# Patient Record
Sex: Male | Born: 1990 | Race: Black or African American | Hispanic: No | Marital: Single | Smoking: Current every day smoker
Health system: Southern US, Community
[De-identification: ages and names within clinical notes are randomized; demographics above are authoritative.]

## PROBLEM LIST (undated history)

## (undated) DIAGNOSIS — N442 Benign cyst of testis: Secondary | ICD-10-CM

## (undated) DIAGNOSIS — K219 Gastro-esophageal reflux disease without esophagitis: Secondary | ICD-10-CM

## (undated) DIAGNOSIS — F419 Anxiety disorder, unspecified: Secondary | ICD-10-CM

## (undated) HISTORY — PX: ESOPHAGOGASTRODUODENOSCOPY: SHX1529

## (undated) HISTORY — PX: HERNIA REPAIR: SHX51

---

## 2019-05-15 ENCOUNTER — Emergency Department (HOSPITAL_COMMUNITY)
Admission: EM | Admit: 2019-05-15 | Discharge: 2019-05-15 | Disposition: A | Discharge status: Home / Self Care | Payer: Self-pay

## 2019-05-16 ENCOUNTER — Emergency Department (HOSPITAL_COMMUNITY): Payer: Medicaid - Out of State

## 2019-05-16 ENCOUNTER — Emergency Department (HOSPITAL_COMMUNITY)
Admission: EM | Admit: 2019-05-16 | Discharge: 2019-05-16 | Disposition: A | Discharge status: Home / Self Care | Payer: Medicaid - Out of State | Attending: Emergency Medicine | Admitting: Emergency Medicine

## 2019-05-16 ENCOUNTER — Encounter (HOSPITAL_COMMUNITY): Payer: Self-pay | Admitting: Emergency Medicine

## 2019-05-16 ENCOUNTER — Other Ambulatory Visit: Payer: Self-pay

## 2019-05-16 DIAGNOSIS — R079 Chest pain, unspecified: Secondary | ICD-10-CM

## 2019-05-16 DIAGNOSIS — R197 Diarrhea, unspecified: Secondary | ICD-10-CM | POA: Insufficient documentation

## 2019-05-16 DIAGNOSIS — R1084 Generalized abdominal pain: Secondary | ICD-10-CM | POA: Diagnosis not present

## 2019-05-16 DIAGNOSIS — R112 Nausea with vomiting, unspecified: Secondary | ICD-10-CM

## 2019-05-16 DIAGNOSIS — R0789 Other chest pain: Secondary | ICD-10-CM | POA: Diagnosis not present

## 2019-05-16 DIAGNOSIS — Z79899 Other long term (current) drug therapy: Secondary | ICD-10-CM | POA: Diagnosis not present

## 2019-05-16 DIAGNOSIS — R1031 Right lower quadrant pain: Secondary | ICD-10-CM | POA: Diagnosis present

## 2019-05-16 HISTORY — DX: Anxiety disorder, unspecified: F41.9

## 2019-05-16 HISTORY — DX: Gastro-esophageal reflux disease without esophagitis: K21.9

## 2019-05-16 LAB — CBC
HCT: 46.6 % (ref 39.0–52.0)
Hemoglobin: 16.4 g/dL (ref 13.0–17.0)
MCH: 32.5 pg (ref 26.0–34.0)
MCHC: 35.2 g/dL (ref 30.0–36.0)
MCV: 92.3 fL (ref 80.0–100.0)
Platelets: 209 10*3/uL (ref 150–400)
RBC: 5.05 MIL/uL (ref 4.22–5.81)
RDW: 13 % (ref 11.5–15.5)
WBC: 14.4 10*3/uL — ABNORMAL HIGH (ref 4.0–10.5)
nRBC: 0 % (ref 0.0–0.2)

## 2019-05-16 LAB — URINALYSIS, ROUTINE W REFLEX MICROSCOPIC
Glucose, UA: 50 mg/dL — AB
Ketones, ur: 80 mg/dL — AB
Leukocytes,Ua: NEGATIVE
Nitrite: NEGATIVE
Protein, ur: 100 mg/dL — AB
Specific Gravity, Urine: 1.035 — ABNORMAL HIGH (ref 1.005–1.030)
pH: 6 (ref 5.0–8.0)

## 2019-05-16 LAB — COMPREHENSIVE METABOLIC PANEL
ALT: 20 U/L (ref 0–44)
AST: 24 U/L (ref 15–41)
Albumin: 5.5 g/dL — ABNORMAL HIGH (ref 3.5–5.0)
Alkaline Phosphatase: 55 U/L (ref 38–126)
Anion gap: 19 — ABNORMAL HIGH (ref 5–15)
BUN: 16 mg/dL (ref 6–20)
CO2: 24 mmol/L (ref 22–32)
Calcium: 10.6 mg/dL — ABNORMAL HIGH (ref 8.9–10.3)
Chloride: 94 mmol/L — ABNORMAL LOW (ref 98–111)
Creatinine, Ser: 1.24 mg/dL (ref 0.61–1.24)
GFR calc Af Amer: >60 mL/min (ref >60)
GFR calc non Af Amer: >60 mL/min (ref >60)
Glucose, Bld: 106 mg/dL — ABNORMAL HIGH (ref 70–99)
Potassium: 3.3 mmol/L — ABNORMAL LOW (ref 3.5–5.1)
Sodium: 137 mmol/L (ref 135–145)
Total Bilirubin: 0.9 mg/dL (ref 0.3–1.2)
Total Protein: 8.5 g/dL — ABNORMAL HIGH (ref 6.5–8.1)

## 2019-05-16 LAB — LIPASE, BLOOD: Lipase: 17 U/L (ref 11–51)

## 2019-05-16 LAB — TROPONIN I (HIGH SENSITIVITY): Troponin I (High Sensitivity): 4 ng/L (ref <18)

## 2019-05-16 MED ORDER — ONDANSETRON HCL 4 MG/2ML IJ SOLN
4.0000 mg | Freq: Once | INTRAMUSCULAR | Status: AC
  Administered 2019-05-16: 4 mg via INTRAVENOUS
  Filled 2019-05-16: qty 2

## 2019-05-16 MED ORDER — MORPHINE SULFATE (PF) 4 MG/ML IV SOLN
4.0000 mg | Freq: Once | INTRAVENOUS | Status: AC
  Administered 2019-05-16: 4 mg via INTRAVENOUS
  Filled 2019-05-16: qty 1

## 2019-05-16 MED ORDER — IOHEXOL 300 MG/ML  SOLN
100.0000 mL | Freq: Once | INTRAMUSCULAR | Status: AC | PRN
  Administered 2019-05-16: 100 mL via INTRAVENOUS

## 2019-05-16 MED ORDER — SODIUM CHLORIDE (PF) 0.9 % IJ SOLN
INTRAMUSCULAR | Status: AC
  Filled 2019-05-16: qty 50

## 2019-05-16 MED ORDER — SODIUM CHLORIDE 0.9 % IV BOLUS
1000.0000 mL | Freq: Once | INTRAVENOUS | Status: AC
  Administered 2019-05-16: 1000 mL via INTRAVENOUS

## 2019-05-16 MED ORDER — ONDANSETRON 4 MG PO TBDP: 4.0000 mg | ORAL_TABLET | Freq: Three times a day (TID) | ORAL | 0 refills | Status: DC | PRN

## 2019-05-16 MED ORDER — KETOROLAC TROMETHAMINE 30 MG/ML IJ SOLN
30.0000 mg | Freq: Once | INTRAMUSCULAR | Status: AC
  Administered 2019-05-16: 30 mg via INTRAVENOUS
  Filled 2019-05-16: qty 1

## 2019-05-16 MED ORDER — PROMETHAZINE HCL 25 MG/ML IJ SOLN
25.0000 mg | Freq: Once | INTRAMUSCULAR | Status: AC
  Administered 2019-05-16: 25 mg via INTRAVENOUS
  Filled 2019-05-16: qty 1

## 2019-05-16 NOTE — ED Notes (Signed)
Patient transported to CT 

## 2019-05-16 NOTE — ED Triage Notes (Signed)
Pt having abd pains, vomiting and right groin pains since yesterday. Hx hernia surgery when younger. Has acid reflux but doesn't take his medications.

## 2019-05-16 NOTE — ED Provider Notes (Signed)
[facility] DEPT Provider Note   CSN: [id number] Arrival date & time: 05/16/19  1123     History Chief Complaint  Patient presents with  . Abdominal Pain  . Groin Pain    Logan Cooper is a 29 y.o. male with a past medical history significant for anxiety and GERD who presents to the ED due to gradual onset of worsening right-sided abdominal pain that radiates into the right groin region x1 day.  Patient admits to associated testicular pain.  He also admits to nausea, nonbloody/ nonbilious emesis, and nonbloody diarrhea.  Denies fever and chills.  He admits to having a previous hernia surgery when he was younger however does not remember where it was located.  He also admits to central chest pain which he notes feels like "he has to burp" and associated with shortness of breath.  Denies lower extremity edema.  Denies history of blood clots, recent surgeries, recent long immobilizations, and hormonal treatments.  Admits to decreased urination. Denies dysuria and back pain. Patient admits to frequent marijuana use.   History obtained from patient and past medical records. No interpreter used during encounter.      Past Medical History:  Diagnosis Date  . Acid reflux   . Anxiety     There are no problems to display for this patient.   Past Surgical History:  Procedure Laterality Date  . HERNIA REPAIR         No family history on file.  Social History   Tobacco Use  . Smoking status: Not on file  Substance Use Topics  . Alcohol use: Not on file  . Drug use: Not on file    Home Medications Prior to Admission medications   Medication Sig Start Date End Date Taking? Authorizing Provider  Potassium 95 MG TABS Take 1 tablet by mouth once.   Yes [provider]  ondansetron (ZOFRAN ODT) 4 MG disintegrating tablet Take 1 tablet (4 mg total) by mouth every 8 (eight) hours as needed for nausea or vomiting. 05/16/19   Suzy Bouchard, PA-C    Allergies    Patient has no known allergies.  Review of Systems   Review of Systems  Constitutional: Negative for chills and fever.  Respiratory: Positive for shortness of breath.   Cardiovascular: Positive for chest pain. Negative for leg swelling.  Gastrointestinal: Positive for abdominal pain, diarrhea, nausea and vomiting.  Genitourinary: Positive for difficulty urinating. Negative for dysuria.  Musculoskeletal: Negative for back pain.  All other systems reviewed and are negative.   Physical Exam Updated Vital Signs BP 132/81   Pulse (!) 53   Temp 100.1 F (37.8 C) (Oral)   Resp 16   Ht 5\' 8"  (1.727 m)   Wt 68.8 kg   SpO2 98%   BMI 23.05 kg/m   Physical Exam Vitals and nursing note reviewed. Exam conducted with a chaperone present.  Constitutional:      General: He is not in acute distress.    Appearance: He is not toxic-appearing.     Comments: Appears uncomfortable in bed actively vomiting in emesis bag.  HENT:     Head: Normocephalic.  Eyes:     Pupils: Pupils are equal, round, and reactive to light.  Cardiovascular:     Rate and Rhythm: Normal rate and regular rhythm.     Pulses: Normal pulses.     Heart sounds: Normal heart sounds. No murmur. No friction rub. No gallop.  Pulmonary:     Effort: Pulmonary effort is normal.     Breath sounds: Normal breath sounds.  Abdominal:     General: Abdomen is flat. Bowel sounds are normal. There is no distension.     Palpations: Abdomen is soft.     Tenderness: There is abdominal tenderness. There is guarding. There is no right CVA tenderness or rebound.     Comments: Generalized abdominal tenderness most significant and right lower quadrant and right groin with voluntary guarding.  No rebound.  Genitourinary:    Penis: Circumcised.      Testes:        Right: Tenderness present.        Left: Tenderness present.     Epididymis:     Right: Tenderness present.     Left: Tenderness present.      Comments: High rising right testicle.  Musculoskeletal:     Cervical back: Neck supple.     Comments: Able to move all 4 extremities without difficulty.  No lower extremity edema.  Skin:    General: Skin is warm and dry.  Neurological:     General: No focal deficit present.     Mental Status: He is alert.  Psychiatric:        Mood and Affect: Mood normal.        Behavior: Behavior normal.     ED Results / Procedures / Treatments   Labs (all labs ordered are listed, but only abnormal results are displayed) Labs Reviewed  COMPREHENSIVE METABOLIC PANEL - Abnormal; Notable for the following components:      Result Value   Potassium 3.3 (*)    Chloride 94 (*)    Glucose, Bld 106 (*)    Calcium 10.6 (*)    Total Protein 8.5 (*)    Albumin 5.5 (*)    Anion gap 19 (*)    All other components within normal limits  CBC - Abnormal; Notable for the following components:   WBC 14.4 (*)    All other components within normal limits  URINALYSIS, ROUTINE W REFLEX MICROSCOPIC - Abnormal; Notable for the following components:   Color, Urine AMBER (*)    APPearance HAZY (*)    Specific Gravity, Urine 1.035 (*)    Glucose, UA 50 (*)    Hgb urine dipstick SMALL (*)    Bilirubin Urine SMALL (*)    Ketones, ur 80 (*)    Protein, ur 100 (*)    Bacteria, UA FEW (*)    All other components within normal limits  LIPASE, BLOOD  TROPONIN I (HIGH SENSITIVITY)  TROPONIN I (HIGH SENSITIVITY)    EKG EKG Interpretation  Date/Time:  Thursday May 16 2019 13:31:59 EDT Ventricular Rate:  61 PR Interval:    QRS Duration: 103 QT Interval:  402 QTC Calculation: 405 R Axis:   57 Text Interpretation: Sinus rhythm RSR' in V1 or V2, right VCD or RVH Nonspecific T abnrm, anterolateral leads No old tracing to compare Confirmed by Pricilla Loveless [phone]) on 05/16/2019 3:08:32 PM   Radiology DG Chest Portable 1 View  Result Date: 05/16/2019 CLINICAL DATA:  Chest pain and shortness of breath. Vomiting  and right groin pain since yesterday. EXAM: PORTABLE CHEST 1 VIEW COMPARISON:  None. FINDINGS: The heart size and mediastinal contours are within normal limits. Both lungs are clear. The visualized skeletal structures are unremarkable. IMPRESSION: Normal chest x-ray. Electronically Signed   By: Rudie Meyer M.D.   On: 05/16/2019 13:54  US SCROTUM W/DOPPLER  Result Date: 05/16/2019 CLINICAL DATA:  Right-sided testicular pain EXAM: SCROTAL ULTRASOUND DOPPLER ULTRASOUND OF THE TESTICLES TECHNIQUE: Complete ultrasound examination of the testicles, epididymis, and other scrotal structures was performed. Color and spectral Doppler ultrasound were also utilized to evaluate blood flow to the testicles. COMPARISON:  None. FINDINGS: Right testicle Measurements: 3.6 x 1.8 x 2.7. No mass or microlithiasis visualized. Left testicle Measurements: 4.1 x 2.5 x 3.5. No mass or microlithiasis visualized. Right epididymis:  Normal in size and appearance. Left epididymis:  Normal in size and appearance. Hydrocele: Left hydrocele is noted. This is mildly septated suggesting a more chronic process. Varicocele:  Mild left varicocele is noted. Pulsed Doppler interrogation of both testes demonstrates normal low resistance arterial and venous waveforms bilaterally. IMPRESSION: No evidence of testicular torsion. Small left varicocele. Left hydrocele which appears septated in nature suggesting a more chronic process. Electronically Signed   By: Alcide Clever M.D.   On: 05/16/2019 14:51    Procedures Procedures (including critical care time)  Medications Ordered in ED Medications  sodium chloride (PF) 0.9 % injection (has no administration in time range)  promethazine (PHENERGAN) injection 25 mg (has no administration in time range)  morphine 4 MG/ML injection 4 mg (has no administration in time range)  morphine 4 MG/ML injection 4 mg (4 mg Intravenous Given 05/16/19 1333)  ondansetron (ZOFRAN) injection 4 mg (4 mg Intravenous  Given 05/16/19 1333)  iohexol (OMNIPAQUE) 300 MG/ML solution 100 mL (100 mLs Intravenous Contrast Given 05/16/19 1443)    ED Course  I have reviewed the triage vital signs and the nursing notes.  Pertinent labs & imaging results that were available during my care of the patient were reviewed by me and considered in my medical decision making (see chart for details).  Clinical Course as of May 16 1519  Thu May 16, 2019  1328 Albumin(!): 5.5 [CA]  1443 WBC(!): 14.4 [CA]  1444 Potassium(!): 3.3 [CA]    Clinical Course User Index [CA] Mannie Stabile, PA-C   MDM Rules/Calculators/A&P                     29 year old male presents to the ED due to abdominal pain that radiates into his right groin associated with right testicular pain.  Abdominal pain associated with nausea, vomiting, and diarrhea.  Vitals all within normal limits.  Patient in no acute distress and nontoxic-appearing.  Abdomen soft, non-distended with generalized tenderness throughout with voluntary guarding.  Right testicle high rising with bilateral testicular tenderness.  Will obtain routine labs, scrotal ultrasound, and CT abdomen to rule out testicular torsion and emergent intra-abdominal etiologies.  Lipase normal at 17.  Doubt pancreatitis.  CBC significant for leukocytosis of 14.4, but otherwise unremarkable.  CMP significant for hypokalemia 3.3.  X-ray personally reviewed which is negative for signs of pneumonia, pneumothorax, or widened mediastinum.  EKG personally reviewed which demonstrates normal sinus rhythm with no signs of acute ischemia. [place] personally reviewed which demonstrates: IMPRESSION:  No evidence of testicular torsion.    Small left varicocele.    Left hydrocele which appears septated in nature suggesting a more  chronic process.   UA negative for signs of infection, but significant for ketonuria and proteinuria likely due to dehydration from emesis.  Troponin normal at 4.  Will obtain delta  troponin.  Very low suspicion of ACS given atypical presentation and normal EKG.  3:11 PM reassessed patient at bedside who admits to continued nausea and abdominal  pain. Will give Phenergan and more morphine. EKG without QTC prolongation.  Patient handed off to [facility], PA-C at shift change who will follow up with CT scan, reassess patient, and determine disposition.  If CT scan is normal and patient passes p.o. challenge patient may be discharged home with Zofran and PCP follow-up.   Final Clinical Impression(s) / ED Diagnoses Final diagnoses:  Generalized abdominal pain  Nausea vomiting and diarrhea  Nonspecific chest pain    Rx / DC Orders ED Discharge Orders         Ordered    ondansetron (ZOFRAN ODT) 4 MG disintegrating tablet  Every 8 hours PRN     05/16/19 1518           Mannie Stabile, PA-C 05/16/19 1522    Pricilla Loveless, MD 05/17/19 [phone]

## 2019-05-16 NOTE — Discharge Instructions (Addendum)
As discussed, your ultrasound was negative for testicular infection or torsion. Your CT scan was reassuring . I am sending you home with zofran for nausea. Take as prescribed.  You may take over-the-counter ibuprofen or Tylenol as needed for abdominal pain.  If her symptoms not improved within the next week, follow-up with PCP for further evaluation.  Return to the ER for new or worsening symptoms.

## 2019-05-17 ENCOUNTER — Observation Stay (HOSPITAL_COMMUNITY)
Admission: EM | Admit: 2019-05-17 | Discharge: 2019-05-19 | Disposition: A | Discharge status: Home / Self Care | Payer: Medicaid - Out of State | Attending: Internal Medicine | Admitting: Internal Medicine

## 2019-05-17 ENCOUNTER — Encounter (HOSPITAL_COMMUNITY): Payer: Self-pay | Admitting: Emergency Medicine

## 2019-05-17 DIAGNOSIS — N50811 Right testicular pain: Secondary | ICD-10-CM | POA: Insufficient documentation

## 2019-05-17 DIAGNOSIS — N433 Hydrocele, unspecified: Secondary | ICD-10-CM | POA: Diagnosis not present

## 2019-05-17 DIAGNOSIS — K219 Gastro-esophageal reflux disease without esophagitis: Secondary | ICD-10-CM | POA: Diagnosis not present

## 2019-05-17 DIAGNOSIS — Z79899 Other long term (current) drug therapy: Secondary | ICD-10-CM | POA: Insufficient documentation

## 2019-05-17 DIAGNOSIS — E876 Hypokalemia: Secondary | ICD-10-CM | POA: Diagnosis not present

## 2019-05-17 DIAGNOSIS — R112 Nausea with vomiting, unspecified: Principal | ICD-10-CM | POA: Insufficient documentation

## 2019-05-17 DIAGNOSIS — I861 Scrotal varices: Secondary | ICD-10-CM | POA: Diagnosis not present

## 2019-05-17 DIAGNOSIS — R1115 Cyclical vomiting syndrome unrelated to migraine: Secondary | ICD-10-CM

## 2019-05-17 DIAGNOSIS — Z20822 Contact with and (suspected) exposure to covid-19: Secondary | ICD-10-CM | POA: Diagnosis not present

## 2019-05-17 DIAGNOSIS — R197 Diarrhea, unspecified: Secondary | ICD-10-CM | POA: Diagnosis not present

## 2019-05-17 DIAGNOSIS — R0789 Other chest pain: Secondary | ICD-10-CM | POA: Diagnosis present

## 2019-05-17 DIAGNOSIS — G43A0 Cyclical vomiting, not intractable: Secondary | ICD-10-CM | POA: Diagnosis present

## 2019-05-17 DIAGNOSIS — R1031 Right lower quadrant pain: Secondary | ICD-10-CM | POA: Insufficient documentation

## 2019-05-17 DIAGNOSIS — G8929 Other chronic pain: Secondary | ICD-10-CM | POA: Insufficient documentation

## 2019-05-17 DIAGNOSIS — F17200 Nicotine dependence, unspecified, uncomplicated: Secondary | ICD-10-CM | POA: Insufficient documentation

## 2019-05-17 LAB — CBC WITH DIFFERENTIAL/PLATELET
Abs Immature Granulocytes: 0.02 10*3/uL (ref 0.00–0.07)
Basophils Absolute: 0.1 10*3/uL (ref 0.0–0.1)
Basophils Relative: 1 %
Eosinophils Absolute: 0 10*3/uL (ref 0.0–0.5)
Eosinophils Relative: 0 %
HCT: 46.4 % (ref 39.0–52.0)
Hemoglobin: 16.5 g/dL (ref 13.0–17.0)
Immature Granulocytes: 0 %
Lymphocytes Relative: 15 %
Lymphs Abs: 1.5 10*3/uL (ref 0.7–4.0)
MCH: 32.9 pg (ref 26.0–34.0)
MCHC: 35.6 g/dL (ref 30.0–36.0)
MCV: 92.6 fL (ref 80.0–100.0)
Monocytes Absolute: 0.6 10*3/uL (ref 0.1–1.0)
Monocytes Relative: 6 %
Neutro Abs: 7.8 10*3/uL — ABNORMAL HIGH (ref 1.7–7.7)
Neutrophils Relative %: 78 %
Platelets: 188 10*3/uL (ref 150–400)
RBC: 5.01 MIL/uL (ref 4.22–5.81)
RDW: 12.9 % (ref 11.5–15.5)
WBC: 9.9 10*3/uL (ref 4.0–10.5)
nRBC: 0 % (ref 0.0–0.2)

## 2019-05-17 LAB — CBC
HCT: 43.2 % (ref 39.0–52.0)
Hemoglobin: 15.1 g/dL (ref 13.0–17.0)
MCH: 32.8 pg (ref 26.0–34.0)
MCHC: 35 g/dL (ref 30.0–36.0)
MCV: 93.9 fL (ref 80.0–100.0)
Platelets: 168 10*3/uL (ref 150–400)
RBC: 4.6 MIL/uL (ref 4.22–5.81)
RDW: 13 % (ref 11.5–15.5)
WBC: 13.5 10*3/uL — ABNORMAL HIGH (ref 4.0–10.5)
nRBC: 0 % (ref 0.0–0.2)

## 2019-05-17 LAB — RAPID URINE DRUG SCREEN, HOSP PERFORMED
Amphetamines: NOT DETECTED
Barbiturates: NOT DETECTED
Benzodiazepines: NOT DETECTED
Cocaine: NOT DETECTED
Opiates: POSITIVE — AB
Tetrahydrocannabinol: POSITIVE — AB

## 2019-05-17 LAB — COMPREHENSIVE METABOLIC PANEL
ALT: 26 U/L (ref 0–44)
AST: 36 U/L (ref 15–41)
Albumin: 5.1 g/dL — ABNORMAL HIGH (ref 3.5–5.0)
Alkaline Phosphatase: 53 U/L (ref 38–126)
Anion gap: 18 — ABNORMAL HIGH (ref 5–15)
BUN: 17 mg/dL (ref 6–20)
CO2: 23 mmol/L (ref 22–32)
Calcium: 10.3 mg/dL (ref 8.9–10.3)
Chloride: 96 mmol/L — ABNORMAL LOW (ref 98–111)
Creatinine, Ser: 1.37 mg/dL — ABNORMAL HIGH (ref 0.61–1.24)
GFR calc Af Amer: >60 mL/min (ref >60)
GFR calc non Af Amer: >60 mL/min (ref >60)
Glucose, Bld: 122 mg/dL — ABNORMAL HIGH (ref 70–99)
Potassium: 2.8 mmol/L — ABNORMAL LOW (ref 3.5–5.1)
Sodium: 137 mmol/L (ref 135–145)
Total Bilirubin: 1.3 mg/dL — ABNORMAL HIGH (ref 0.3–1.2)
Total Protein: 7.8 g/dL (ref 6.5–8.1)

## 2019-05-17 LAB — ETHANOL: Alcohol, Ethyl (B): <10 mg/dL (ref <10)

## 2019-05-17 LAB — TROPONIN I (HIGH SENSITIVITY): Troponin I (High Sensitivity): 3 ng/L (ref <18)

## 2019-05-17 LAB — MAGNESIUM: Magnesium: 1.8 mg/dL (ref 1.7–2.4)

## 2019-05-17 LAB — HIV ANTIBODY (ROUTINE TESTING W REFLEX): HIV Screen 4th Generation wRfx: NONREACTIVE

## 2019-05-17 LAB — SARS CORONAVIRUS 2 (TAT 6-24 HRS): SARS Coronavirus 2: NEGATIVE

## 2019-05-17 MED ORDER — POTASSIUM CHLORIDE 10 MEQ/100ML IV SOLN
10.0000 meq | INTRAVENOUS | Status: AC
  Administered 2019-05-17 (×3): 10 meq via INTRAVENOUS
  Filled 2019-05-17 (×3): qty 100

## 2019-05-17 MED ORDER — FENTANYL CITRATE (PF) 100 MCG/2ML IJ SOLN
25.0000 ug | Freq: Once | INTRAMUSCULAR | Status: AC
  Administered 2019-05-17: 08:00:00 25 ug via INTRAVENOUS
  Filled 2019-05-17: qty 2

## 2019-05-17 MED ORDER — KCL IN DEXTROSE-NACL 40-5-0.9 MEQ/L-%-% IV SOLN
INTRAVENOUS | Status: DC
  Filled 2019-05-17 (×4): qty 1000

## 2019-05-17 MED ORDER — ENOXAPARIN SODIUM 40 MG/0.4ML ~~LOC~~ SOLN
40.0000 mg | SUBCUTANEOUS | Status: DC
  Filled 2019-05-17: qty 0.4

## 2019-05-17 MED ORDER — ALUM & MAG HYDROXIDE-SIMETH 200-200-20 MG/5ML PO SUSP
30.0000 mL | Freq: Four times a day (QID) | ORAL | Status: DC | PRN
  Administered 2019-05-17: 30 mL via ORAL
  Filled 2019-05-17: qty 30

## 2019-05-17 MED ORDER — ONDANSETRON HCL 4 MG/2ML IJ SOLN: 4.0000 mg | Freq: Four times a day (QID) | INTRAMUSCULAR | Status: DC | PRN

## 2019-05-17 MED ORDER — HALOPERIDOL LACTATE 5 MG/ML IJ SOLN
2.0000 mg | Freq: Once | INTRAMUSCULAR | Status: AC
  Administered 2019-05-17: 2 mg via INTRAVENOUS
  Filled 2019-05-17: qty 1

## 2019-05-17 MED ORDER — ONDANSETRON HCL 4 MG PO TABS: 4.0000 mg | ORAL_TABLET | Freq: Four times a day (QID) | ORAL | Status: DC | PRN

## 2019-05-17 MED ORDER — PANTOPRAZOLE SODIUM 40 MG IV SOLR
40.0000 mg | INTRAVENOUS | Status: DC
  Administered 2019-05-17 – 2019-05-18 (×2): 40 mg via INTRAVENOUS
  Filled 2019-05-17 (×2): qty 40

## 2019-05-17 MED ORDER — ONDANSETRON HCL 4 MG/2ML IJ SOLN
4.0000 mg | Freq: Once | INTRAMUSCULAR | Status: AC
  Administered 2019-05-17: 4 mg via INTRAVENOUS
  Filled 2019-05-17: qty 2

## 2019-05-17 MED ORDER — SODIUM CHLORIDE 0.9 % IV BOLUS
1000.0000 mL | Freq: Once | INTRAVENOUS | Status: AC
  Administered 2019-05-17: 1000 mL via INTRAVENOUS

## 2019-05-17 MED ORDER — MORPHINE SULFATE (PF) 2 MG/ML IV SOLN
2.0000 mg | Freq: Four times a day (QID) | INTRAVENOUS | Status: DC | PRN
  Administered 2019-05-17 – 2019-05-18 (×4): 2 mg via INTRAVENOUS
  Filled 2019-05-17 (×4): qty 1

## 2019-05-17 NOTE — ED Notes (Addendum)
Patient called out for ice chips and warm blanket.   Warm blanket given and explained to patient I just gave him more nausea medication and we need to get nausea under control first before ice chips.   Patient states "I think its just indigestion"  Patient also admits to smoking marijuana.   Patient also requesting oxygen. Patients oxygen 99%. Patient denies shortness of breath. Explained to patient he does not need to oxygen. Patient verbalizes understanding.

## 2019-05-17 NOTE — H&P (Signed)
History and Physical    Logan Cooper YKZ:[id number] DOB: 1990-04-19 DOA: 05/17/2019  PCP: Patient, No Pcp Per   Patient coming from: Home  I have personally briefly reviewed patient's old medical records in [facility]  Chief Complaint: Abdominal Pain                               Chest pain  HPI: Logan Cooper is a 29 y.o. male with medical history significant for anxiety, GERD, alcohol and cannabinoid use who presents to the ED for the second time in 2 days with a chief complaint of chest pain and abdominal pain that has continued since yesterday despite home antiemetics. Patient was seen and evaluated yesterday here in the ED for similar symptoms.  States that his symptoms began approximately 24 hours ago.  He did have some relief of his symptoms when he left the ER, but then went home and continued to have nonbloody, nonbilious emesis.  He also reports diarrhea, generalized abdominal pain with radiation to the right groin, chest pain mostly mid sternal and non radiating.  His chest pain has been intermittent without specific aggravating or alleviating factor.  He denies any leg swelling, urinary symptoms, sick contacts with similar symptoms, suspicious food ingestions.  He admits to daily alcohol use, usually drinks about 1-2 40s a day but has not drank any alcohol for the past 3 to 4 days.  Also admits to daily marijuana use. He had an ultrasound which showed no evidence of testicular torsion and a small left varicocele.  Had a CT scan of abdomen and pelvis which showed a normal appendix. He has cold chills but denies having any fever,  no urinary symptoms, no headaches, no cough, no dizziness or lightheadedness  ED Course:  Patient presents for evaluation of refractory nausea and vomiting.  This is patient's second visit to the emergency room was seen in the ER on 05/16/19. Work-up revealed elevation of his anion gap to 18, hypokalemia of 2.8 which is  decreased from yesterday's visit.  Normal mag noted.  UDS positive for THC.  Troponin is negative x1.  Patient given 2 rounds of antiemetics and attempted pain control but patient continues to be symptomatic and does not feel that he can manage his symptoms at home.  Had a discussion with the patient regarding admission versus discharge and he would like to be admitted as he continues to have nausea and inability to tolerate anything by mouth.  Covid test is pending.  Decision was made to admit patient to the hospital  Review of Systems: As per HPI otherwise 10 point review of systems negative.    Past Medical History:  Diagnosis Date  . Acid reflux   . Anxiety     Past Surgical History:  Procedure Laterality Date  . HERNIA REPAIR       reports that he has been smoking. He has never used smokeless tobacco. He reports current alcohol use. He reports current drug use. Drug: Marijuana.  No Known Allergies  History reviewed. No pertinent family history.   Prior to Admission medications   Medication Sig Start Date End Date Taking? Authorizing Provider  ondansetron (ZOFRAN ODT) 4 MG disintegrating tablet Take 1 tablet (4 mg total) by mouth every 8 (eight) hours as needed for nausea or vomiting. 05/16/19   Mannie Stabile, PA-C  Potassium 95 MG TABS Take 1 tablet by mouth once.  [provider]    Physical Exam: Vitals:   05/17/19 0700 05/17/19 0737 05/17/19 0800 05/17/19 0830  BP: (!) 127/92 138/81 131/78 117/73  Pulse: (!) 58 (!) 57 (!) 58 69  Resp: 19 18 (!) 22 13  Temp:      TempSrc:      SpO2: 99% 99% 94% 98%  Weight:      Height:         Vitals:   05/17/19 0700 05/17/19 0737 05/17/19 0800 05/17/19 0830  BP: (!) 127/92 138/81 131/78 117/73  Pulse: (!) 58 (!) 57 (!) 58 69  Resp: 19 18 (!) 22 13  Temp:      TempSrc:      SpO2: 99% 99% 94% 98%  Weight:      Height:        Constitutional: NAD, alert and oriented x 3 Eyes: PERRL, lids and conjunctivae  normal ENMT: Mucous membranes are moist.  Neck: normal, supple, no masses, no thyromegaly Respiratory: clear to auscultation bilaterally, no wheezing, no crackles. Normal respiratory effort. No accessory muscle use.  Cardiovascular: Regular rate and rhythm, no murmurs / rubs / gallops. No extremity edema. 2+ pedal pulses. No carotid bruits.  Producible chest pain in the mid sternal area Abdomen:  Tenderness periumbilical, no masses palpated. No hepatosplenomegaly. Bowel sounds positive.  Musculoskeletal: no clubbing / cyanosis. No joint deformity upper and lower extremities.  Skin: no rashes, lesions, ulcers.  Neurologic: No gross focal neurologic deficit. Psychiatric: Normal mood and affect.   Labs on Admission: I have personally reviewed following labs and imaging studies  CBC: Recent Labs  Lab 05/16/19 1234 05/17/19 0642  WBC 14.4* 9.9  NEUTROABS  --  7.8*  HGB 16.4 16.5  HCT 46.6 46.4  MCV 92.3 92.6  PLT 209 188   Basic Metabolic Panel: Recent Labs  Lab 05/16/19 1234 05/17/19 0642  NA 137 137  K 3.3* 2.8*  CL 94* 96*  CO2 24 23  GLUCOSE 106* 122*  BUN 16 17  CREATININE 1.24 1.37*  CALCIUM 10.6* 10.3  MG  --  1.8   GFR: Estimated Creatinine Clearance: 75.8 mL/min (A) (by C-G formula based on SCr of 1.37 mg/dL (H)). Liver Function Tests: Recent Labs  Lab 05/16/19 1234 05/17/19 0642  AST 24 36  ALT 20 26  ALKPHOS 55 53  BILITOT 0.9 1.3*  PROT 8.5* 7.8  ALBUMIN 5.5* 5.1*   Recent Labs  Lab 05/16/19 1234  LIPASE 17   No results for input(s): AMMONIA in the last 168 hours. Coagulation Profile: No results for input(s): INR, PROTIME in the last 168 hours. Cardiac Enzymes: No results for input(s): CKTOTAL, CKMB, CKMBINDEX, TROPONINI in the last 168 hours. BNP (last 3 results) No results for input(s): PROBNP in the last 8760 hours. HbA1C: No results for input(s): HGBA1C in the last 72 hours. CBG: No results for input(s): GLUCAP in the last 168  hours. Lipid Profile: No results for input(s): CHOL, HDL, LDLCALC, TRIG, CHOLHDL, LDLDIRECT in the last 72 hours. Thyroid Function Tests: No results for input(s): TSH, T4TOTAL, FREET4, T3FREE, THYROIDAB in the last 72 hours. Anemia Panel: No results for input(s): VITAMINB12, FOLATE, FERRITIN, TIBC, IRON, RETICCTPCT in the last 72 hours. Urine analysis:    Component Value Date/Time   COLORURINE AMBER (A) 05/16/2019 1345   APPEARANCEUR HAZY (A) 05/16/2019 1345   LABSPEC 1.035 (H) 05/16/2019 1345   PHURINE 6.0 05/16/2019 1345   GLUCOSEU 50 (A) 05/16/2019 1345   HGBUR SMALL (A)  05/16/2019 1345   BILIRUBINUR SMALL (A) 05/16/2019 1345   KETONESUR 80 (A) 05/16/2019 1345   PROTEINUR 100 (A) 05/16/2019 1345   NITRITE NEGATIVE 05/16/2019 1345   LEUKOCYTESUR NEGATIVE 05/16/2019 1345    Radiological Exams on Admission: CT ABDOMEN PELVIS W CONTRAST  Result Date: 05/16/2019 CLINICAL DATA:  Acute generalized abdominal pain. EXAM: CT ABDOMEN AND PELVIS WITH CONTRAST TECHNIQUE: Multidetector CT imaging of the abdomen and pelvis was performed using the standard protocol following bolus administration of intravenous contrast. CONTRAST:  140mL OMNIPAQUE IOHEXOL 300 MG/ML  SOLN COMPARISON:  None. FINDINGS: Lower chest: No acute abnormality. Hepatobiliary: No focal liver abnormality is seen. No gallstones, gallbladder wall thickening, or biliary dilatation. Pancreas: Unremarkable. No pancreatic ductal dilatation or surrounding inflammatory changes. Spleen: Normal in size without focal abnormality. Adrenals/Urinary Tract: Adrenal glands are unremarkable. Kidneys are normal, without renal calculi, focal lesion, or hydronephrosis. Bladder is unremarkable. Stomach/Bowel: Stomach is within normal limits. Appendix appears normal. No evidence of bowel wall thickening, distention, or inflammatory changes. Vascular/Lymphatic: No significant vascular findings are present. No enlarged abdominal or pelvic lymph nodes.  Reproductive: Prostate is unremarkable. Other: No abdominal wall hernia or abnormality. No abdominopelvic ascites. Musculoskeletal: No acute or significant osseous findings. IMPRESSION: No definite abnormality seen in the abdomen or pelvis. Electronically Signed   By: Marijo Conception M.D.   On: 05/16/2019 15:19   DG Chest Portable 1 View  Result Date: 05/16/2019 CLINICAL DATA:  Chest pain and shortness of breath. Vomiting and right groin pain since yesterday. EXAM: PORTABLE CHEST 1 VIEW COMPARISON:  None. FINDINGS: The heart size and mediastinal contours are within normal limits. Both lungs are clear. The visualized skeletal structures are unremarkable. IMPRESSION: Normal chest x-ray. Electronically Signed   By: Marijo Sanes M.D.   On: 05/16/2019 13:54   US SCROTUM W/DOPPLER  Result Date: 05/16/2019 CLINICAL DATA:  Right-sided testicular pain EXAM: SCROTAL ULTRASOUND DOPPLER ULTRASOUND OF THE TESTICLES TECHNIQUE: Complete ultrasound examination of the testicles, epididymis, and other scrotal structures was performed. Color and spectral Doppler ultrasound were also utilized to evaluate blood flow to the testicles. COMPARISON:  None. FINDINGS: Right testicle Measurements: 3.6 x 1.8 x 2.7. No mass or microlithiasis visualized. Left testicle Measurements: 4.1 x 2.5 x 3.5. No mass or microlithiasis visualized. Right epididymis:  Normal in size and appearance. Left epididymis:  Normal in size and appearance. Hydrocele: Left hydrocele is noted. This is mildly septated suggesting a more chronic process. Varicocele:  Mild left varicocele is noted. Pulsed Doppler interrogation of both testes demonstrates normal low resistance arterial and venous waveforms bilaterally. IMPRESSION: No evidence of testicular torsion. Small left varicocele. Left hydrocele which appears septated in nature suggesting a more chronic process. Electronically Signed   By: Inez Catalina M.D.   On: 05/16/2019 14:51    EKG: Independently  reviewed.   Assessment/Plan Active Problems:   Refractory nausea and vomiting   Hypokalemia   GERD (gastroesophageal reflux disease)    Refractory nausea and vomiting Etiology unclear and may be secondary to cannabinoid hyperemesis syndrome Patientt had a CT scan of abdomen and pelvis which showed a normal appendix.  Lipase level is within normal limits Keep patient n.p.o. Supportive care with antiemetics and IV fluid hydration   Hypokalemia Secondary to GI losses from nausea and vomiting Supplement potassium   GERD IV PPI  DVT prophylaxis: Lovenox Code Status: Full Family Communication: Plan of care was discussed with patient and he verbalizes understanding and agrees with the plan Disposition Plan: Back  to previous home environment Consults called: None    Lina Hitch MD [facility]     05/17/2019, 8:39 AM

## 2019-05-17 NOTE — ED Triage Notes (Signed)
Patient arrived with mom from home. Patient c/o ABD pain and Chest pain since yesterday 0600.   Patient stated they have had 6 episodes of vomiting yellowish-greenish fluid.   Patient was seen at [facility] yesterday for same symptoms.

## 2019-05-17 NOTE — ED Provider Notes (Signed)
[facility]-EMERGENCY DEPT Provider Note   CSN: [id number] Arrival date & time: 05/17/19  3151     History Chief Complaint  Patient presents with  . Abdominal Pain  . Chest Pain    Logan Cooper is a 29 y.o. male with a past medical history of anxiety, GERD, presenting to the ED with a chief complaint of chest pain, abdominal pain that has continued since yesterday despite home antiemetics. Patient was seen and evaluated yesterday here in the ED for similar symptoms.  States that his symptoms began approximately 24 hours ago.  He did have some relief of his symptoms when he left the ER, but then went home and continued to have nonbloody, nonbilious emesis.  He also reports diarrhea, generalized abdominal pain, chest pain and cough.  His chest pain has been intermittent without specific aggravating or alleviating factor.  He denies any leg swelling, urinary symptoms, sick contacts with similar symptoms, suspicious food ingestions.  He admits to daily alcohol use, usually drinks about 1-2 40s a day but has not drank any alcohol for the past 3 to 4 days.  Also admits to daily marijuana use.  HPI     Past Medical History:  Diagnosis Date  . Acid reflux   . Anxiety     There are no problems to display for this patient.   Past Surgical History:  Procedure Laterality Date  . HERNIA REPAIR         History reviewed. No pertinent family history.  Social History   Tobacco Use  . Smoking status: Current Every Day Smoker  . Smokeless tobacco: Never Used  Substance Use Topics  . Alcohol use: Yes  . Drug use: Yes    Types: Marijuana    Home Medications Prior to Admission medications   Medication Sig Start Date End Date Taking? Authorizing Provider  ondansetron (ZOFRAN ODT) 4 MG disintegrating tablet Take 1 tablet (4 mg total) by mouth every 8 (eight) hours as needed for nausea or vomiting. 05/16/19   Mannie Stabile, PA-C  Potassium 95 MG  TABS Take 1 tablet by mouth once.    [provider]    Allergies    Patient has no known allergies.  Review of Systems   Review of Systems  Constitutional: Negative for appetite change, chills and fever.  HENT: Negative for ear pain, rhinorrhea, sneezing and sore throat.   Eyes: Negative for photophobia and visual disturbance.  Respiratory: Negative for cough, chest tightness, shortness of breath and wheezing.   Cardiovascular: Positive for chest pain. Negative for palpitations.  Gastrointestinal: Positive for abdominal pain, diarrhea, nausea and vomiting. Negative for blood in stool and constipation.  Genitourinary: Negative for dysuria, hematuria and urgency.  Musculoskeletal: Negative for myalgias.  Skin: Negative for rash.  Neurological: Negative for dizziness, weakness and light-headedness.    Physical Exam Updated Vital Signs BP 131/78   Pulse (!) 58   Temp 97.9 F (36.6 C) (Oral)   Resp (!) 22   Ht 5\' 8"  (1.727 m)   Wt 68 kg   SpO2 94%   BMI 22.81 kg/m   Physical Exam Vitals and nursing note reviewed.  Constitutional:      General: He is not in acute distress.    Appearance: He is well-developed.  HENT:     Head: Normocephalic and atraumatic.     Nose: Nose normal.  Eyes:     General: No scleral icterus.       Left  eye: No discharge.     Conjunctiva/sclera: Conjunctivae normal.  Cardiovascular:     Rate and Rhythm: Normal rate and regular rhythm.     Heart sounds: Normal heart sounds. No murmur. No friction rub. No gallop.   Pulmonary:     Effort: Pulmonary effort is normal. No respiratory distress.     Breath sounds: Normal breath sounds.  Abdominal:     General: Bowel sounds are normal. There is no distension.     Palpations: Abdomen is soft.     Tenderness: There is generalized abdominal tenderness. There is no guarding.  Genitourinary:    Testes:        Right: Swelling not present.        Left: Swelling not present.  Musculoskeletal:          General: Normal range of motion.     Cervical back: Normal range of motion and neck supple.  Skin:    General: Skin is warm and dry.     Findings: No rash.  Neurological:     Mental Status: He is alert.     Motor: No abnormal muscle tone.     Coordination: Coordination normal.     ED Results / Procedures / Treatments   Labs (all labs ordered are listed, but only abnormal results are displayed) Labs Reviewed  COMPREHENSIVE METABOLIC PANEL - Abnormal; Notable for the following components:      Result Value   Potassium 2.8 (*)    Chloride 96 (*)    Glucose, Bld 122 (*)    Creatinine, Ser 1.37 (*)    Albumin 5.1 (*)    Total Bilirubin 1.3 (*)    Anion gap 18 (*)    All other components within normal limits  CBC WITH DIFFERENTIAL/PLATELET - Abnormal; Notable for the following components:   Neutro Abs 7.8 (*)    All other components within normal limits  RAPID URINE DRUG SCREEN, HOSP PERFORMED - Abnormal; Notable for the following components:   Opiates POSITIVE (*)    Tetrahydrocannabinol POSITIVE (*)    All other components within normal limits  SARS CORONAVIRUS 2 (TAT 6-24 HRS)  ETHANOL  MAGNESIUM  TROPONIN I (HIGH SENSITIVITY)    EKG EKG Interpretation  Date/Time:  Friday May 17 2019 06:31:41 EDT Ventricular Rate:  58 PR Interval:    QRS Duration: 101 QT Interval:  410 QTC Calculation: 403 R Axis:   61 Text Interpretation: Sinus rhythm No significant change since last tracing Confirmed by Rochele Raring [phone]) on 05/17/2019 6:38:28 AM   Radiology CT ABDOMEN PELVIS W CONTRAST  Result Date: 05/16/2019 CLINICAL DATA:  Acute generalized abdominal pain. EXAM: CT ABDOMEN AND PELVIS WITH CONTRAST TECHNIQUE: Multidetector CT imaging of the abdomen and pelvis was performed using the standard protocol following bolus administration of intravenous contrast. CONTRAST:  OMNIPAQUE IOHEXOL 300 MG/ML  SOLN COMPARISON:  None. FINDINGS: Lower chest: No acute abnormality.  Hepatobiliary: No focal liver abnormality is seen. No gallstones, gallbladder wall thickening, or biliary dilatation. Pancreas: Unremarkable. No pancreatic ductal dilatation or surrounding inflammatory changes. Spleen: Normal in size without focal abnormality. Adrenals/Urinary Tract: Adrenal glands are unremarkable. Kidneys are normal, without renal calculi, focal lesion, or hydronephrosis. Bladder is unremarkable. Stomach/Bowel: Stomach is within normal limits. Appendix appears normal. No evidence of bowel wall thickening, distention, or inflammatory changes. Vascular/Lymphatic: No significant vascular findings are present. No enlarged abdominal or pelvic lymph nodes. Reproductive: Prostate is unremarkable. Other: No abdominal wall hernia or abnormality. No abdominopelvic ascites. Musculoskeletal:  No acute or significant osseous findings. IMPRESSION: No definite abnormality seen in the abdomen or pelvis. Electronically Signed   By: Marijo Conception M.D.   On: 05/16/2019 15:19   DG Chest Portable 1 View  Result Date: 05/16/2019 CLINICAL DATA:  Chest pain and shortness of breath. Vomiting and right groin pain since yesterday. EXAM: PORTABLE CHEST 1 VIEW COMPARISON:  None. FINDINGS: The heart size and mediastinal contours are within normal limits. Both lungs are clear. The visualized skeletal structures are unremarkable. IMPRESSION: Normal chest x-ray. Electronically Signed   By: Marijo Sanes M.D.   On: 05/16/2019 13:54   US SCROTUM W/DOPPLER  Result Date: 05/16/2019 CLINICAL DATA:  Right-sided testicular pain EXAM: SCROTAL ULTRASOUND DOPPLER ULTRASOUND OF THE TESTICLES TECHNIQUE: Complete ultrasound examination of the testicles, epididymis, and other scrotal structures was performed. Color and spectral Doppler ultrasound were also utilized to evaluate blood flow to the testicles. COMPARISON:  None. FINDINGS: Right testicle Measurements: 3.6 x 1.8 x 2.7. No mass or microlithiasis visualized. Left testicle  Measurements: 4.1 x 2.5 x 3.5. No mass or microlithiasis visualized. Right epididymis:  Normal in size and appearance. Left epididymis:  Normal in size and appearance. Hydrocele: Left hydrocele is noted. This is mildly septated suggesting a more chronic process. Varicocele:  Mild left varicocele is noted. Pulsed Doppler interrogation of both testes demonstrates normal low resistance arterial and venous waveforms bilaterally. IMPRESSION: No evidence of testicular torsion. Small left varicocele. Left hydrocele which appears septated in nature suggesting a more chronic process. Electronically Signed   By: Inez Catalina M.D.   On: 05/16/2019 14:51    Procedures Procedures (including critical care time)  Medications Ordered in ED Medications  potassium chloride 10 mEq in 100 mL IVPB (10 mEq Intravenous New Bag/Given 05/17/19 0742)  sodium chloride 0.9 % bolus 1,000 mL (1,000 mLs Intravenous New Bag/Given 05/17/19 0702)  haloperidol lactate (HALDOL) injection 2 mg (2 mg Intravenous Given 05/17/19 0703)  fentaNYL (SUBLIMAZE) injection 25 mcg (25 mcg Intravenous Given 05/17/19 0739)  ondansetron (ZOFRAN) injection 4 mg (4 mg Intravenous Given 05/17/19 0739)    ED Course  I have reviewed the triage vital signs and the nursing notes.  Pertinent labs & imaging results that were available during my care of the patient were reviewed by me and considered in my medical decision making (see chart for details).  Clinical Course as of May 17 811  Fri May 17, 2019  0730 Decreased from 3.3 yesterday.  Potassium(!): 2.8 [HK]  0730 Creatinine(!): 1.37 [HK]  0734 No signs of withdrawal.  Alcohol, Ethyl (B): <10 [HK]  0751 Tetrahydrocannabinol(!): POSITIVE [HK]    Clinical Course User Index [HK] Delia Heady, PA-C   MDM Rules/Calculators/A&P                      Reily Ilic was evaluated in Emergency Department on 05/17/19 for the symptoms described in the history of present illness. He/she  was evaluated in the context of the global COVID-19 pandemic, which necessitated consideration that the patient might be at risk for infection with the SARS-CoV-2 virus that causes COVID-19. Institutional protocols and algorithms that pertain to the evaluation of patients at risk for COVID-19 are in a state of rapid change based on information released by regulatory bodies including the CDC and federal and state organizations. These policies and algorithms were followed during the patient's care in the ED.  29 year old male with past medical history of GERD, anxiety, alcohol use  and marijuana use presenting to the ED for continued abdominal pain and chest pain.  He reports several episodes of nonbloody, nonbilious emesis and diarrhea as well.  His symptoms began about 24 hours ago and had some improvement after he left the ER yesterday.  States that he did take home antiemetics with last dose being last night.  He admits to daily alcohol use up until 3 to 4 days ago.  Also admits to daily marijuana use.  I reviewed the chart from patient's visit yesterday, he had a negative CT scan of his abdomen and pelvis, ultrasound of the testicles without any emergent findings.  Lab work was overall reassuring, mostly showing signs of dehydration.  EKG was nonischemic. On exam today patient has generalized abdominal tenderness without rebound or guarding.  His chest pain is atypical without specific aggravating or alleviating factors.  His vital signs are within normal limits, he is afebrile, he is not tachycardic, tachypneic or hypoxic.  No signs of DVT on exam.  Will obtain some repeat lab work, give IV fluids, Haldol and reassess.  So far EKG shows normal sinus rhythm.  Work-up here with continued elevation anion gap to 18, hypokalemia of 2.8 which is decreased from yesterday's visit.  Normal mag noted.  UDS positive for THC.  Troponin is negative x1.  Patient given 2 rounds of antiemetics and attempted pain control but  patient continues to be symptomatic and does not feel that he can manage his symptoms at home.  Had a discussion with the patient regarding admission versus discharge and he would like to be admitted as he continues to have nausea and inability to tolerate anything by mouth.  Covid test is pending.  Will admit to hospitalist service for what appears to be viral illness versus cyclical vomiting secondary to marijuana use. I have personally reviewed and interpreted all lab work and imaging from today and yesterday's visit.  Final Clinical Impression(s) / ED Diagnoses Final diagnoses:  Hypokalemia  Cyclical vomiting  Chest wall pain    Rx / DC Orders ED Discharge Orders    None     Portions of this note were generated with Dragon dictation software. Dictation errors may occur despite best attempts at proofreading.    Dietrich Pates, PA-C 05/17/19 9924    Pricilla Loveless, MD 05/17/19 [phone]

## 2019-05-17 NOTE — ED Notes (Addendum)
Patient called out for nausea and "medication not working". Patient sitting up in bed rocking holding emesis bag. No vomit noticed in emesis bag. Patient also requesting ice chips. Explained to patient we need to get his nausea under control before ice chips.   Hina, PA made aware and at bedside.

## 2019-05-17 NOTE — ED Notes (Signed)
Patients mother at bedside to visit patient again. Explained to mother once you are at bedside you can not keep going in and out. Mother verbalized understanding and stated she is going home and will come back once patient gets a room upstairs.

## 2019-05-17 NOTE — ED Notes (Signed)
Patients visitor/mother requested to run outside for her belongings.

## 2019-05-17 NOTE — ED Notes (Signed)
Patient called out for more pain medication. Patient reports 8/10 pain.   Agbata, MD made aware.

## 2019-05-18 DIAGNOSIS — R1115 Cyclical vomiting syndrome unrelated to migraine: Secondary | ICD-10-CM | POA: Diagnosis not present

## 2019-05-18 DIAGNOSIS — E876 Hypokalemia: Secondary | ICD-10-CM | POA: Diagnosis not present

## 2019-05-18 DIAGNOSIS — R0789 Other chest pain: Secondary | ICD-10-CM

## 2019-05-18 LAB — BASIC METABOLIC PANEL
Anion gap: 7 (ref 5–15)
BUN: 8 mg/dL (ref 6–20)
CO2: 26 mmol/L (ref 22–32)
Calcium: 8.9 mg/dL (ref 8.9–10.3)
Chloride: 109 mmol/L (ref 98–111)
Creatinine, Ser: 1.02 mg/dL (ref 0.61–1.24)
GFR calc Af Amer: >60 mL/min (ref >60)
GFR calc non Af Amer: >60 mL/min (ref >60)
Glucose, Bld: 107 mg/dL — ABNORMAL HIGH (ref 70–99)
Potassium: 3.9 mmol/L (ref 3.5–5.1)
Sodium: 142 mmol/L (ref 135–145)

## 2019-05-18 LAB — CBC
HCT: 41.3 % (ref 39.0–52.0)
Hemoglobin: 13.8 g/dL (ref 13.0–17.0)
MCH: 32.4 pg (ref 26.0–34.0)
MCHC: 33.4 g/dL (ref 30.0–36.0)
MCV: 96.9 fL (ref 80.0–100.0)
Platelets: 149 10*3/uL — ABNORMAL LOW (ref 150–400)
RBC: 4.26 MIL/uL (ref 4.22–5.81)
RDW: 13.1 % (ref 11.5–15.5)
WBC: 7.5 10*3/uL (ref 4.0–10.5)
nRBC: 0 % (ref 0.0–0.2)

## 2019-05-18 MED ORDER — NICOTINE 21 MG/24HR TD PT24
21.0000 mg | MEDICATED_PATCH | Freq: Every day | TRANSDERMAL | Status: DC
  Administered 2019-05-18: 19:00:00 21 mg via TRANSDERMAL
  Filled 2019-05-18: qty 1

## 2019-05-18 MED ORDER — POTASSIUM CHLORIDE ER 10 MEQ PO TBCR: 20.0000 meq | EXTENDED_RELEASE_TABLET | Freq: Every day | ORAL | 0 refills | Status: DC

## 2019-05-18 MED ORDER — ONDANSETRON 4 MG PO TBDP: 4.0000 mg | ORAL_TABLET | Freq: Three times a day (TID) | ORAL | 0 refills | Status: DC | PRN

## 2019-05-18 MED ORDER — ALUM & MAG HYDROXIDE-SIMETH 200-200-20 MG/5ML PO SUSP: 30.0000 mL | Freq: Four times a day (QID) | ORAL | 0 refills | Status: DC | PRN

## 2019-05-18 MED ORDER — ACETAMINOPHEN 325 MG PO TABS
650.0000 mg | ORAL_TABLET | Freq: Four times a day (QID) | ORAL | Status: DC | PRN
  Administered 2019-05-18: 650 mg via ORAL
  Filled 2019-05-18: qty 2

## 2019-05-18 MED ORDER — LIP MEDEX EX OINT
TOPICAL_OINTMENT | CUTANEOUS | Status: AC
  Filled 2019-05-18: qty 7

## 2019-05-18 MED ORDER — FAMOTIDINE 20 MG PO TABS: 20.0000 mg | ORAL_TABLET | Freq: Two times a day (BID) | ORAL | 1 refills | Status: AC

## 2019-05-18 MED ORDER — ACETAMINOPHEN 325 MG PO TABS: 650.0000 mg | ORAL_TABLET | Freq: Four times a day (QID) | ORAL | Status: DC | PRN

## 2019-05-18 NOTE — Progress Notes (Signed)
Have alerted MD via text that pt says if he leaves today he will have to just come back. C/o RLQ pain after eating  3/4 of lunch.

## 2019-05-18 NOTE — TOC Initial Note (Signed)
Transition of Care [facility]) - Initial/Assessment Note    Patient Details  Name: Logan Cooper MRN: [id number] Date of Birth: 01/23/1991  [facility] CM/SW Contact:    Armanda Heritage, RN Phone Number: 05/18/2019, 1:44 PM  Clinical Narrative:      CM noted TOC consult.  Patient moved to [facility] in June of 2020 but reports that he has not gone through the process of changing his medicaid to [facility].  Patient is insured with out of state medicaid and will need to have his medicaid switched to [facility].  Patient is not a candidate for the [facility] program.  Patient was instructed to contact DSS to begin the process of changing his medicaid.              Expected Discharge Plan: Home/Self Care Barriers to Discharge: No Barriers Identified   Patient Goals and CMS Choice Patient states their goals for this hospitalization and ongoing recovery are:: to go home      Expected Discharge Plan and Services Expected Discharge Plan: Home/Self Care   Discharge Planning Services: CM Consult     Expected Discharge Date: 05/18/19               DME Arranged: N/A DME Agency: NA       HH Arranged: NA HH Agency: NA        Prior Living Arrangements/Services     Patient language and need for interpreter reviewed:: Yes        Need for Family Participation in Patient Care: No (Comment) Care giver support system in place?: No (comment)   Criminal Activity/Legal Involvement Pertinent to Current Situation/Hospitalization: No - Comment as needed  Activities of Daily Living Home Assistive Devices/Equipment: None ADL Screening (condition at time of admission) Patient's cognitive ability adequate to safely complete daily activities?: Yes Is the patient deaf or have difficulty hearing?: No Does the patient have difficulty seeing, even when wearing glasses/contacts?: No Does the patient have difficulty concentrating, remembering, or making decisions?: Yes(a little  trouble with memory) Patient able to express need for assistance with ADLs?: Yes Does the patient have difficulty dressing or bathing?: No Independently performs ADLs?: Yes (appropriate for developmental age) Does the patient have difficulty walking or climbing stairs?: No Weakness of Legs: Both Weakness of Arms/Hands: Both  Permission Sought/Granted                  Emotional Assessment   Attitude/Demeanor/Rapport: Engaged Affect (typically observed): Accepting Orientation: : Oriented to Self, Oriented to Place, Oriented to  Time, Oriented to Situation   Psych Involvement: No (comment)  Admission diagnosis:  Cyclical vomiting [R11.15] Hypokalemia [E87.6] Chest wall pain [R07.89] Refractory nausea and vomiting [R11.2] Patient Active Problem List   Diagnosis Date Noted  . Cyclical vomiting   . Chest wall pain   . Refractory nausea and vomiting 05/17/2019  . Hypokalemia 05/17/2019  . GERD (gastroesophageal reflux disease) 05/17/2019   PCP:  Patient, No Pcp Per Pharmacy:   [id number] - [place], [place] - [address] AT [facility] [address] [place] [address] Phone: [phone] Fax: [phone]     Social Determinants of Health (SDOH) Interventions    Readmission Risk Interventions No flowsheet data found.

## 2019-05-18 NOTE — Discharge Summary (Addendum)
Physician Discharge Summary  Logan Cooper EPP:[id number] DOB: Aug 24, 1990 DOA: 05/17/2019  PCP: Patient, No Pcp Per  Admit date: 05/17/2019 Discharge date: 05/19/19  Admitted From: Home disposition: Home  Recommendations for Outpatient Follow-up:  1. Follow up with [facility] in 1 week.  2. Please obtain BMP/CBC in one week, he has hypokalemia.   Home Health: None Equipment/Devices: None  Discharge Condition: Stable and improved CODE STATUS: Full code Diet recommendation: Cardiac diet  Brief/Interim Summary:28 y.o. male with medical history significant for anxiety, GERD, alcohol and cannabinoid use who presents to the ED for the second time in 2 days with a chief complaint of chest pain and abdominal pain that has continued since yesterdaydespite home antiemetics. Patient was seen and evaluated yesterday here in the EDfor similar symptoms. States that his symptoms began approximately 24 hours ago. He did have some relief of his symptoms when he left the ER, but then went home and continued to have nonbloody, nonbilious emesis. He also reports diarrhea, generalized abdominal pain with radiation to the right groin, chest pain mostly mid sternal and non radiating. His chest pain has been intermittent without specific aggravating or alleviating factor. He denies any leg swelling, urinary symptoms, sick contacts with similar symptoms, suspicious food ingestions. He admits to daily alcohol use, usually drinks about 1-2 40s a day but hasnot drank any alcohol for the past 3 to 4 days. Also admits to daily marijuana use. He had an ultrasound which showed no evidence of testicular torsion and a small left varicocele.  Had a CT scan of abdomen and pelvis which showed a normal appendix. He has cold chills but denies having any fever,  no urinary symptoms, no headaches, no cough, no dizziness or lightheadedness  ED Course:  Patient presents for evaluation of  refractory nausea and vomiting.  This is patient's second visit to the emergency room was seen in the ER on 05/16/19. Work-up revealed elevation of his anion gap to 18, hypokalemia of 2.8 which is decreased from yesterday's visit. Normal mag noted. UDS positive for THC. Troponin is negative x1. Patient given 2 rounds of antiemetics and attempted pain control but patient continues to be symptomatic and does not feel that he can manage his symptoms at home. Had a discussion with the patient regarding admission versus discharge and he would like to be admitted as he continues to have nausea and inability to tolerate anything by mouth. Covid test is pending. Decision was made to admit patient to the hospital  Discharge Diagnoses:  Active Problems:   Refractory nausea and vomiting   Hypokalemia   GERD (gastroesophageal reflux disease) #1 intractable nausea and vomiting-patient admitted with intractable nausea and vomiting likely secondary to cannabinoid ingestion.  He reports using cannabinoid daily by smoking for chronic low back pain. His lipase is normal. He was started on a soft diet today and he was able to tolerate that. He will be discharged home today to follow-up at [facility] He is from out of state he is from [place].  #2 chronic hypokalemia he is on potassium replacement at home I have given him potassium tablets upon discharge and to have his BMP checked next week at [facility].  #3 GERD Pepcid on discharge.  Estimated body mass index is 22.81 kg/m as calculated from the following:   Height as of this encounter: 5\' 8"  (1.727 m).   Weight as of this encounter: 68 kg.  Discharge Instructions  Discharge  Instructions    Diet - low sodium heart healthy   Complete by: As directed    Increase activity slowly   Complete by: As directed      Allergies as of 05/18/2019   No Known Allergies     Medication List    STOP taking these  medications   Potassium 95 MG Tabs     TAKE these medications   acetaminophen 325 MG tablet Commonly known as: TYLENOL Take 2 tablets (650 mg total) by mouth every 6 (six) hours as needed for headache.   alum & mag hydroxide-simeth 200-200-20 MG/5ML suspension Commonly known as: MAALOX/MYLANTA Take 30 mLs by mouth every 6 (six) hours as needed for indigestion or heartburn.   famotidine 20 MG tablet Commonly known as: PEPCID Take 1 tablet (20 mg total) by mouth 2 (two) times daily.   ondansetron 4 MG disintegrating tablet Commonly known as: Zofran ODT Take 1 tablet (4 mg total) by mouth every 8 (eight) hours as needed for nausea or vomiting.   potassium chloride 10 MEQ tablet Commonly known as: KLOR-CON Take 2 tablets (20 mEq total) by mouth daily.      Follow-up Information    [facility] Follow up.   Why: check bmp in one week he has hypokalemia chronic Contact information: [address] [facility] [address] [phone]         No Known Allergies  Consultations: None  Procedures/Studies: CT ABDOMEN PELVIS W CONTRAST  Result Date: 05/16/2019 CLINICAL DATA:  Acute generalized abdominal pain. EXAM: CT ABDOMEN AND PELVIS WITH CONTRAST TECHNIQUE: Multidetector CT imaging of the abdomen and pelvis was performed using the standard protocol following bolus administration of intravenous contrast. CONTRAST:  OMNIPAQUE IOHEXOL 300 MG/ML  SOLN COMPARISON:  None. FINDINGS: Lower chest: No acute abnormality. Hepatobiliary: No focal liver abnormality is seen. No gallstones, gallbladder wall thickening, or biliary dilatation. Pancreas: Unremarkable. No pancreatic ductal dilatation or surrounding inflammatory changes. Spleen: Normal in size without focal abnormality. Adrenals/Urinary Tract: Adrenal glands are unremarkable. Kidneys are normal, without renal calculi, focal lesion, or hydronephrosis. Bladder is unremarkable.  Stomach/Bowel: Stomach is within normal limits. Appendix appears normal. No evidence of bowel wall thickening, distention, or inflammatory changes. Vascular/Lymphatic: No significant vascular findings are present. No enlarged abdominal or pelvic lymph nodes. Reproductive: Prostate is unremarkable. Other: No abdominal wall hernia or abnormality. No abdominopelvic ascites. Musculoskeletal: No acute or significant osseous findings. IMPRESSION: No definite abnormality seen in the abdomen or pelvis. Electronically Signed   By: Lupita Raider M.D.   On: 05/16/2019 15:19   DG Chest Portable 1 View  Result Date: 05/16/2019 CLINICAL DATA:  Chest pain and shortness of breath. Vomiting and right groin pain since yesterday. EXAM: PORTABLE CHEST 1 VIEW COMPARISON:  None. FINDINGS: The heart size and mediastinal contours are within normal limits. Both lungs are clear. The visualized skeletal structures are unremarkable. IMPRESSION: Normal chest x-ray. Electronically Signed   By: Rudie Meyer M.D.   On: 05/16/2019 13:54   US SCROTUM W/DOPPLER  Result Date: 05/16/2019 CLINICAL DATA:  Right-sided testicular pain EXAM: SCROTAL ULTRASOUND DOPPLER ULTRASOUND OF THE TESTICLES TECHNIQUE: Complete ultrasound examination of the testicles, epididymis, and other scrotal structures was performed. Color and spectral Doppler ultrasound were also utilized to evaluate blood flow to the testicles. COMPARISON:  None. FINDINGS: Right testicle Measurements: 3.6 x 1.8 x 2.7. No mass or microlithiasis visualized. Left testicle Measurements: 4.1 x 2.5 x 3.5. No mass or microlithiasis  visualized. Right epididymis:  Normal in size and appearance. Left epididymis:  Normal in size and appearance. Hydrocele: Left hydrocele is noted. This is mildly septated suggesting a more chronic process. Varicocele:  Mild left varicocele is noted. Pulsed Doppler interrogation of both testes demonstrates normal low resistance arterial and venous waveforms  bilaterally. IMPRESSION: No evidence of testicular torsion. Small left varicocele. Left hydrocele which appears septated in nature suggesting a more chronic process. Electronically Signed   By: Alcide Clever M.D.   On: 05/16/2019 14:51    (Echo, Carotid, EGD, Colonoscopy, ERCP)    Subjective: He is awake alert complains of some abdominal pain anxious to eat something and see how he does  Discharge Exam: Vitals:   05/17/19 2147 05/18/19 0435  BP: 116/72 103/63  Pulse: 60 (!) 48  Resp: 20 19  Temp: 97.7 F (36.5 C) 97.7 F (36.5 C)  SpO2: 99% 100%   Vitals:   05/17/19 1430 05/17/19 1536 05/17/19 2147 05/18/19 0435  BP: 106/64 (!) 104/92 116/72 103/63  Pulse: 61 67 60 (!) 48  Resp: 18 18 20 19   Temp:  99.3 F (37.4 C) 97.7 F (36.5 C) 97.7 F (36.5 C)  TempSrc:  Oral Oral Oral  SpO2: 92% 98% 99% 100%  Weight:      Height:        General: Pt is alert, awake, not in acute distress Cardiovascular: RRR, S1/S2 +, no rubs, no gallops Respiratory: CTA bilaterally, no wheezing, no rhonchi Abdominal: Soft, NT, ND, bowel sounds + Extremities: no edema, no cyanosis    The results of significant diagnostics from this hospitalization (including imaging, microbiology, ancillary and laboratory) are listed below for reference.     Microbiology: Recent Results (from the past 240 hour(s))  SARS CORONAVIRUS 2 (TAT 6-24 HRS) Nasopharyngeal Nasopharyngeal Swab     Status: None   Collection Time: 05/17/19  8:51 AM   Specimen: Nasopharyngeal Swab  Result Value Ref Range Status   SARS Coronavirus 2 NEGATIVE NEGATIVE Final    Comment: (NOTE) SARS-CoV-2 target nucleic acids are NOT DETECTED. The SARS-CoV-2 RNA is generally detectable in upper and lower respiratory specimens during the acute phase of infection. Negative results do not preclude SARS-CoV-2 infection, do not rule out co-infections with other pathogens, and should not be used as the sole basis for treatment or other patient  management decisions. Negative results must be combined with clinical observations, patient history, and epidemiological information. The expected result is Negative. Fact Sheet for Patients: 05/19/19 Fact Sheet for Healthcare Providers: HairSlick.no This test is not yet approved or cleared by the [place] FDA and  has been authorized for detection and/or diagnosis of SARS-CoV-2 by FDA under an Emergency Use Authorization (EUA). This EUA will remain  in effect (meaning this test can be used) for the duration of the COVID-19 declaration under Section 56 4(b)(1) of the Act, 21 U.S.C. section 360bbb-3(b)(1), unless the authorization is terminated or revoked sooner. Performed at [facility], [address] [place], [place]      Labs: BNP (last 3 results) No results for input(s): BNP in the last 8760 hours. Basic Metabolic Panel: Recent Labs  Lab 05/16/19 1234 05/17/19 0642 05/18/19 0548  NA 137 137 142  K 3.3* 2.8* 3.9  CL 94* 96* 109  CO2 24 23 26   GLUCOSE 106* 122* 107*  BUN 16 17 8   CREATININE 1.24 1.37* 1.02  CALCIUM 10.6* 10.3 8.9  MG  --  1.8  --  Liver Function Tests: Recent Labs  Lab 05/16/19 1234 05/17/19 0642  AST 24 36  ALT 20 26  ALKPHOS 55 53  BILITOT 0.9 1.3*  PROT 8.5* 7.8  ALBUMIN 5.5* 5.1*   Recent Labs  Lab 05/16/19 1234  LIPASE 17   No results for input(s): AMMONIA in the last 168 hours. CBC: Recent Labs  Lab 05/16/19 1234 05/17/19 0642 05/17/19 0940 05/18/19 0548  WBC 14.4* 9.9 13.5* 7.5  NEUTROABS  --  7.8*  --   --   HGB 16.4 16.5 15.1 13.8  HCT 46.6 46.4 43.2 41.3  MCV 92.3 92.6 93.9 96.9  PLT 209 188 168 149*   Cardiac Enzymes: No results for input(s): CKTOTAL, CKMB, CKMBINDEX, TROPONINI in the last 168 hours. BNP: Invalid input(s): POCBNP CBG: No results for input(s): GLUCAP in the last 168 hours. D-Dimer No results for input(s):  DDIMER in the last 72 hours. Hgb A1c No results for input(s): HGBA1C in the last 72 hours. Lipid Profile No results for input(s): CHOL, HDL, LDLCALC, TRIG, CHOLHDL, LDLDIRECT in the last 72 hours. Thyroid function studies No results for input(s): TSH, T4TOTAL, T3FREE, THYROIDAB in the last 72 hours.  Invalid input(s): FREET3 Anemia work up No results for input(s): VITAMINB12, FOLATE, FERRITIN, TIBC, IRON, RETICCTPCT in the last 72 hours. Urinalysis    Component Value Date/Time   COLORURINE AMBER (A) 05/16/2019 1345   APPEARANCEUR HAZY (A) 05/16/2019 1345   LABSPEC 1.035 (H) 05/16/2019 1345   PHURINE 6.0 05/16/2019 1345   GLUCOSEU 50 (A) 05/16/2019 1345   HGBUR SMALL (A) 05/16/2019 1345   BILIRUBINUR SMALL (A) 05/16/2019 1345   KETONESUR 80 (A) 05/16/2019 1345   PROTEINUR 100 (A) 05/16/2019 1345   NITRITE NEGATIVE 05/16/2019 1345   LEUKOCYTESUR NEGATIVE 05/16/2019 1345   Sepsis Labs Invalid input(s): PROCALCITONIN,  WBC,  LACTICIDVEN Microbiology Recent Results (from the past 240 hour(s))  SARS CORONAVIRUS 2 (TAT 6-24 HRS) Nasopharyngeal Nasopharyngeal Swab     Status: None   Collection Time: 05/17/19  8:51 AM   Specimen: Nasopharyngeal Swab  Result Value Ref Range Status   SARS Coronavirus 2 NEGATIVE NEGATIVE Final    Comment: (NOTE) SARS-CoV-2 target nucleic acids are NOT DETECTED. The SARS-CoV-2 RNA is generally detectable in upper and lower respiratory specimens during the acute phase of infection. Negative results do not preclude SARS-CoV-2 infection, do not rule out co-infections with other pathogens, and should not be used as the sole basis for treatment or other patient management decisions. Negative results must be combined with clinical observations, patient history, and epidemiological information. The expected result is Negative. Fact Sheet for Patients: HairSlick.no Fact Sheet for Healthcare  Providers: quierodirigir.com This test is not yet approved or cleared by the [place] FDA and  has been authorized for detection and/or diagnosis of SARS-CoV-2 by FDA under an Emergency Use Authorization (EUA). This EUA will remain  in effect (meaning this test can be used) for the duration of the COVID-19 declaration under Section 56 4(b)(1) of the Act, 21 U.S.C. section 360bbb-3(b)(1), unless the authorization is terminated or revoked sooner. Performed at [facility], [address] [place], [place] [address]      Time coordinating discharge: 39 minutes   SIGNED:   Alwyn Ren, MD  [facility] 05/18/2019, 10:40 AM Pager   If 7PM-7AM, please contact night-coverage [email or web address] Password [email or web address]

## 2019-05-18 NOTE — Progress Notes (Signed)
PROGRESS NOTE    Logan Cooper  GEX:[id number] DOB: [birth year] DOA: 05/17/2019 PCP: Patient, No Pcp Per   Brief Narrative:  29 y.o. male with medical history significant for anxiety, GERD, alcohol and cannabinoid use who presents to the ED for the second time in 2 days with a chief complaint of chest pain and abdominal pain that has continued since yesterdaydespite home antiemetics. Patient was seen and evaluated yesterday here in the EDfor similar symptoms. States that his symptoms began approximately 24 hours ago. He did have some relief of his symptoms when he left the ER, but then went home and continued to have nonbloody, nonbilious emesis. He also reports diarrhea, generalized abdominal pain with radiation to the right groin, chest pain mostly mid sternal and non radiating. His chest pain has been intermittent without specific aggravating or alleviating factor. He denies any leg swelling, urinary symptoms, sick contacts with similar symptoms, suspicious food ingestions. He admits to daily alcohol use, usually drinks about 1-2 40s a day but hasnot drank any alcohol for the past 3 to 4 days. Also admits to daily marijuana use. He had an ultrasound which showed no evidence of testicular torsion and a small left varicocele.  Had a CT scan of abdomen and pelvis which showed a normal appendix. He has cold chills but denies having any fever,  no urinary symptoms, no headaches, no cough, no dizziness or lightheadedness  ED Course:  Patient presents for evaluation of refractory nausea and vomiting.  This is patient's second visit to the emergency room was seen in the ER on 05/16/19. Work-up revealed elevation of his anion gap to 18, hypokalemia of 2.8 which is decreased from yesterday's visit. Normal mag noted. UDS positive for THC. Troponin is negative x1. Patient given 2 rounds of antiemetics and attempted pain control but patient continues to be symptomatic and does not feel  that he can manage his symptoms at home. Had a discussion with the patient regarding admission versus discharge and he would like to be admitted as he continues to have nausea and inability to tolerate anything by mouth. Covid test is pending. Decision was made to admit patient to the hospital  Assessment & Plan:   Active Problems:   Refractory nausea and vomiting   Hypokalemia   GERD (gastroesophageal reflux disease)   Cyclical vomiting   Chest wall pain  Refractory nausea and vomiting-likely due to   cannabinoid hyperemesis syndrome Patientt had a CT scan of abdomen and pelvis which showed a normal appendix.  Lipase level is within normal limits He was not able to tolerate a diet His pain increased after soft diet Supportive care with antiemetics and IV fluid hydration Receiving iv morphine    Hypokalemia Secondary to GI losses from nausea and vomiting Supplement potassium   GERD IV PPI    Estimated body mass index is 22.81 kg/m as calculated from the following:   Height as of this encounter: 5\' 8"  (1.727 m).   Weight as of this encounter: 68 kg.  DVT prophylaxis: lovenox Code Status:full Family Communication:none Disposition Plan:came from home  Plan dc home  Barrier to dc still with nausea and abdominal pain  Consultants:   none  Procedures: none Antimicrobials: none  Subjective: C/o nausea and increased abdominal pain after eating    Objective: Vitals:   05/17/19 1536 05/17/19 2147 05/18/19 0435 05/18/19 1347  BP: (!) 104/92 116/72 103/63 108/65  Pulse: 67 60 (!) 48 68  Resp: 18 20 19  18  Temp: 99.3 F (37.4 C) 97.7 F (36.5 C) 97.7 F (36.5 C) 98.7 F (37.1 C)  TempSrc: Oral Oral Oral Oral  SpO2: 98% 99% 100% 99%  Weight:      Height:        Intake/Output Summary (Last 24 hours) at 05/18/2019 1528 Last data filed at 05/18/2019 1100 Gross per 24 hour  Intake 793.86 ml  Output 550 ml  Net 243.86 ml   Filed Weights   05/17/19 0630    Weight: 68 kg    Examination:  General exam: Appears calm and comfortable  Respiratory system: Clear to auscultation. Respiratory effort normal. Cardiovascular system: S1 & S2 heard, RRR. No JVD, murmurs, rubs, gallops or clicks. No pedal edema. Gastrointestinal system: Abdomen is nondistended, soft and tender. No organomegaly or masses felt. Normal bowel sounds heard. Central nervous system: Alert and oriented. No focal neurological deficits. Extremities: Symmetric 5 x 5 power. Skin: No rashes, lesions or ulcers Psychiatry: Judgement and insight appear normal. Mood & affect appropriate.     Data Reviewed: I have personally reviewed following labs and imaging studies  CBC: Recent Labs  Lab 05/16/19 1234 05/17/19 0642 05/17/19 0940 05/18/19 0548  WBC 14.4* 9.9 13.5* 7.5  NEUTROABS  --  7.8*  --   --   HGB 16.4 16.5 15.1 13.8  HCT 46.6 46.4 43.2 41.3  MCV 92.3 92.6 93.9 96.9  PLT 209 188 168 149*   Basic Metabolic Panel: Recent Labs  Lab 05/16/19 1234 05/17/19 0642 05/18/19 0548  NA 137 137 142  K 3.3* 2.8* 3.9  CL 94* 96* 109  CO2 24 23 26   GLUCOSE 106* 122* 107*  BUN 16 17 8   CREATININE 1.24 1.37* 1.02  CALCIUM 10.6* 10.3 8.9  MG  --  1.8  --    GFR: Estimated Creatinine Clearance: 101.9 mL/min (by C-G formula based on SCr of 1.02 mg/dL). Liver Function Tests: Recent Labs  Lab 05/16/19 1234 05/17/19 0642  AST 24 36  ALT 20 26  ALKPHOS 55 53  BILITOT 0.9 1.3*  PROT 8.5* 7.8  ALBUMIN 5.5* 5.1*   Recent Labs  Lab 05/16/19 1234  LIPASE 17   No results for input(s): AMMONIA in the last 168 hours. Coagulation Profile: No results for input(s): INR, PROTIME in the last 168 hours. Cardiac Enzymes: No results for input(s): CKTOTAL, CKMB, CKMBINDEX, TROPONINI in the last 168 hours. BNP (last 3 results) No results for input(s): PROBNP in the last 8760 hours. HbA1C: No results for input(s): HGBA1C in the last 72 hours. CBG: No results for input(s):  GLUCAP in the last 168 hours. Lipid Profile: No results for input(s): CHOL, HDL, LDLCALC, TRIG, CHOLHDL, LDLDIRECT in the last 72 hours. Thyroid Function Tests: No results for input(s): TSH, T4TOTAL, FREET4, T3FREE, THYROIDAB in the last 72 hours. Anemia Panel: No results for input(s): VITAMINB12, FOLATE, FERRITIN, TIBC, IRON, RETICCTPCT in the last 72 hours. Sepsis Labs: No results for input(s): PROCALCITON, LATICACIDVEN in the last 168 hours.  Recent Results (from the past 240 hour(s))  SARS CORONAVIRUS 2 (TAT 6-24 HRS) Nasopharyngeal Nasopharyngeal Swab     Status: None   Collection Time: 05/17/19  8:51 AM   Specimen: Nasopharyngeal Swab  Result Value Ref Range Status   SARS Coronavirus 2 NEGATIVE NEGATIVE Final    Comment: (NOTE) SARS-CoV-2 target nucleic acids are NOT DETECTED. The SARS-CoV-2 RNA is generally detectable in upper and lower respiratory specimens during the acute phase of infection. Negative results do not preclude SARS-CoV-2  infection, do not rule out co-infections with other pathogens, and should not be used as the sole basis for treatment or other patient management decisions. Negative results must be combined with clinical observations, patient history, and epidemiological information. The expected result is Negative. Fact Sheet for Patients: SugarRoll.be Fact Sheet for Healthcare Providers: https://www.woods-Lydiana Milley.com/ This test is not yet approved or cleared by the [place] FDA and  has been authorized for detection and/or diagnosis of SARS-CoV-2 by FDA under an Emergency Use Authorization (EUA). This EUA will remain  in effect (meaning this test can be used) for the duration of the COVID-19 declaration under Section 56 4(b)(1) of the Act, 21 U.S.C. section 360bbb-3(b)(1), unless the authorization is terminated or revoked sooner. Performed at [facility], [place] [address], [place], [place] [address]            Radiology Studies: No results found.      Scheduled Meds: . enoxaparin (LOVENOX) injection  40 mg Subcutaneous Q24H  . pantoprazole (PROTONIX) IV  40 mg Intravenous Q24H   Continuous Infusions: . dextrose 5 % and 0.9 % NaCl with KCl 40 mEq/L 100 mL/hr at 05/17/19 1649     LOS: 0 days     Georgette Shell, MD 05/18/2019, 3:28 PM

## 2019-05-19 DIAGNOSIS — E876 Hypokalemia: Secondary | ICD-10-CM | POA: Diagnosis not present

## 2019-05-19 MED ORDER — NICOTINE 21 MG/24HR TD PT24: 21.0000 mg | MEDICATED_PATCH | Freq: Every day | TRANSDERMAL | 0 refills | Status: DC

## 2019-05-19 NOTE — Progress Notes (Signed)
Pt leaving this morning with his mother. Alert, oriented, and without c/o. Discharge instructions given/explained with pt verbalizing understanding. Pt aware to followup.

## 2019-06-22 ENCOUNTER — Emergency Department (HOSPITAL_COMMUNITY)
Admission: EM | Admit: 2019-06-22 | Discharge: 2019-06-22 | Disposition: A | Discharge status: Home / Self Care | Payer: Medicaid - Out of State | Attending: Emergency Medicine | Admitting: Emergency Medicine

## 2019-06-22 ENCOUNTER — Emergency Department (HOSPITAL_COMMUNITY): Payer: Medicaid - Out of State

## 2019-06-22 ENCOUNTER — Other Ambulatory Visit: Payer: Self-pay

## 2019-06-22 ENCOUNTER — Encounter (HOSPITAL_COMMUNITY): Payer: Self-pay | Admitting: Emergency Medicine

## 2019-06-22 DIAGNOSIS — R1030 Lower abdominal pain, unspecified: Secondary | ICD-10-CM | POA: Diagnosis not present

## 2019-06-22 DIAGNOSIS — F17200 Nicotine dependence, unspecified, uncomplicated: Secondary | ICD-10-CM | POA: Diagnosis not present

## 2019-06-22 DIAGNOSIS — R42 Dizziness and giddiness: Secondary | ICD-10-CM | POA: Diagnosis not present

## 2019-06-22 DIAGNOSIS — R112 Nausea with vomiting, unspecified: Secondary | ICD-10-CM | POA: Diagnosis not present

## 2019-06-22 DIAGNOSIS — Z20822 Contact with and (suspected) exposure to covid-19: Secondary | ICD-10-CM | POA: Diagnosis not present

## 2019-06-22 DIAGNOSIS — N50812 Left testicular pain: Secondary | ICD-10-CM | POA: Diagnosis present

## 2019-06-22 DIAGNOSIS — N503 Cyst of epididymis: Secondary | ICD-10-CM | POA: Diagnosis not present

## 2019-06-22 LAB — CBC WITH DIFFERENTIAL/PLATELET
Abs Immature Granulocytes: 0.05 10*3/uL (ref 0.00–0.07)
Basophils Absolute: 0.1 10*3/uL (ref 0.0–0.1)
Basophils Relative: 0 %
Eosinophils Absolute: 0 10*3/uL (ref 0.0–0.5)
Eosinophils Relative: 0 %
HCT: 48 % (ref 39.0–52.0)
Hemoglobin: 16.6 g/dL (ref 13.0–17.0)
Immature Granulocytes: 0 %
Lymphocytes Relative: 13 %
Lymphs Abs: 1.8 10*3/uL (ref 0.7–4.0)
MCH: 32.7 pg (ref 26.0–34.0)
MCHC: 34.6 g/dL (ref 30.0–36.0)
MCV: 94.5 fL (ref 80.0–100.0)
Monocytes Absolute: 0.7 10*3/uL (ref 0.1–1.0)
Monocytes Relative: 5 %
Neutro Abs: 10.9 10*3/uL — ABNORMAL HIGH (ref 1.7–7.7)
Neutrophils Relative %: 82 %
Platelets: 194 10*3/uL (ref 150–400)
RBC: 5.08 MIL/uL (ref 4.22–5.81)
RDW: 12.8 % (ref 11.5–15.5)
WBC: 13.4 10*3/uL — ABNORMAL HIGH (ref 4.0–10.5)
nRBC: 0 % (ref 0.0–0.2)

## 2019-06-22 LAB — URINALYSIS, ROUTINE W REFLEX MICROSCOPIC
Bacteria, UA: NONE SEEN
Bilirubin Urine: NEGATIVE
Glucose, UA: NEGATIVE mg/dL
Hgb urine dipstick: NEGATIVE
Ketones, ur: 80 mg/dL — AB
Leukocytes,Ua: NEGATIVE
Nitrite: NEGATIVE
Protein, ur: 30 mg/dL — AB
Specific Gravity, Urine: <1.005 — ABNORMAL LOW (ref 1.005–1.030)
pH: 6 (ref 5.0–8.0)

## 2019-06-22 LAB — COMPREHENSIVE METABOLIC PANEL
ALT: 21 U/L (ref 0–44)
AST: 32 U/L (ref 15–41)
Albumin: 5.3 g/dL — ABNORMAL HIGH (ref 3.5–5.0)
Alkaline Phosphatase: 55 U/L (ref 38–126)
Anion gap: 14 (ref 5–15)
BUN: 7 mg/dL (ref 6–20)
CO2: 22 mmol/L (ref 22–32)
Calcium: 10.1 mg/dL (ref 8.9–10.3)
Chloride: 105 mmol/L (ref 98–111)
Creatinine, Ser: 1.04 mg/dL (ref 0.61–1.24)
GFR calc Af Amer: >60 mL/min (ref >60)
GFR calc non Af Amer: >60 mL/min (ref >60)
Glucose, Bld: 104 mg/dL — ABNORMAL HIGH (ref 70–99)
Potassium: 3.6 mmol/L (ref 3.5–5.1)
Sodium: 141 mmol/L (ref 135–145)
Total Bilirubin: 0.8 mg/dL (ref 0.3–1.2)
Total Protein: 8.1 g/dL (ref 6.5–8.1)

## 2019-06-22 LAB — RESPIRATORY PANEL BY RT PCR (FLU A&B, COVID)
Influenza A by PCR: NEGATIVE
Influenza B by PCR: NEGATIVE
SARS Coronavirus 2 by RT PCR: NEGATIVE

## 2019-06-22 LAB — LIPASE, BLOOD: Lipase: 20 U/L (ref 11–51)

## 2019-06-22 MED ORDER — ONDANSETRON HCL 4 MG/2ML IJ SOLN
4.0000 mg | Freq: Once | INTRAMUSCULAR | Status: AC
  Administered 2019-06-22: 4 mg via INTRAVENOUS
  Filled 2019-06-22: qty 2

## 2019-06-22 MED ORDER — NAPROXEN 375 MG PO TABS: 375.0000 mg | ORAL_TABLET | Freq: Two times a day (BID) | ORAL | 0 refills | Status: AC

## 2019-06-22 MED ORDER — FENTANYL CITRATE (PF) 100 MCG/2ML IJ SOLN
100.0000 ug | Freq: Once | INTRAMUSCULAR | Status: AC
  Administered 2019-06-22: 100 ug via INTRAVENOUS
  Filled 2019-06-22: qty 2

## 2019-06-22 MED ORDER — MORPHINE SULFATE (PF) 4 MG/ML IV SOLN
4.0000 mg | Freq: Once | INTRAVENOUS | Status: AC
  Administered 2019-06-22: 4 mg via INTRAVENOUS
  Filled 2019-06-22: qty 1

## 2019-06-22 MED ORDER — ONDANSETRON 4 MG PO TBDP: 4.0000 mg | ORAL_TABLET | Freq: Three times a day (TID) | ORAL | 0 refills | Status: AC | PRN

## 2019-06-22 MED ORDER — SODIUM CHLORIDE (PF) 0.9 % IJ SOLN
INTRAMUSCULAR | Status: AC
  Filled 2019-06-22: qty 50

## 2019-06-22 MED ORDER — IOHEXOL 300 MG/ML  SOLN
100.0000 mL | Freq: Once | INTRAMUSCULAR | Status: AC | PRN
  Administered 2019-06-22: 100 mL via INTRAVENOUS

## 2019-06-22 NOTE — ED Notes (Signed)
Pt smother called for update. Pt gave verbal permission to share information. Pts mother updated on status

## 2019-06-22 NOTE — ED Notes (Signed)
Pt aware urine sample needed. [place] at bedside

## 2019-06-22 NOTE — Discharge Instructions (Addendum)
As we discussed, your work-up today was reassuring.  He did show evidence of an epididymal cyst but it looks like that has been there before.  This could be contributing to your pain.  You can follow-up with [facility] for evaluation of this.  Take Naprosyn as directed for pain.  Take Zofran for nausea/vomiting.  Your COVID test was negative  Return to the Emergency Dept for any worsening pain, fever, vomiting, difficulty urinating or any other worsening or concerning symptoms.

## 2019-06-22 NOTE — ED Notes (Signed)
Pt verbalizes understanding of DC instructions. Pt belongings returned and is ambulatory out of ED.  

## 2019-06-22 NOTE — ED Notes (Signed)
Pt able to tolerate PO liquids without emesis

## 2019-06-22 NOTE — ED Triage Notes (Signed)
Pt reports that he having testicle pains and possible swelling with left. Reports that he thinks he has 3 of them.

## 2019-06-22 NOTE — ED Notes (Signed)
Called lab to see status of urine results.

## 2019-06-22 NOTE — ED Notes (Signed)
Pt had episode of emesis This RN and NT changed sheets, gown and provided new emesis bag.

## 2019-06-22 NOTE — ED Notes (Signed)
Pt provided cup of water and ice

## 2019-06-22 NOTE — ED Provider Notes (Signed)
[facility] DEPT Provider Note   CSN: [id number] Arrival date & time: 06/22/19  1528     History Chief Complaint  Patient presents with  . Testicle Pain    Logan Cooper is a 29 y.o. male with PMH/o anxiety and acid reflux who presents for evaluation of left testicular pain and swelling that began this morning. He reports that he always has had some pain in his testicles since he was 48. He states that he noticed then at 12 he seemed to have an area that he thought was a "third testicle." He reports that today after waking up he had acute pain and swelling of his left testicle. His pain has since radiates to the right testicle.  He also now feels like the pain is radiating up into his lower abdomen.  He states that he has not had any burning with urination but states it feels uncomfortable when he urinates.  No hematuria.  He has had some vomiting this morning.  No blood in the emesis.  He states he feels lightheaded.  He is not currently sexually active.  He denies any fevers, chest pain, difficulty breathing.  The history is provided by the patient.       Past Medical History:  Diagnosis Date  . Acid reflux   . Anxiety     Patient Active Problem List   Diagnosis Date Noted  . Cyclical vomiting   . Chest wall pain   . Refractory nausea and vomiting 05/17/2019  . Hypokalemia 05/17/2019  . GERD (gastroesophageal reflux disease) 05/17/2019    Past Surgical History:  Procedure Laterality Date  . HERNIA REPAIR         No family history on file.  Social History   Tobacco Use  . Smoking status: Current Every Day Smoker  . Smokeless tobacco: Never Used  Substance Use Topics  . Alcohol use: Yes  . Drug use: Yes    Types: Marijuana    Home Medications Prior to Admission medications   Medication Sig Start Date End Date Taking? Authorizing Provider  acetaminophen (TYLENOL) 325 MG tablet Take 2 tablets (650 mg total) by mouth  every 6 (six) hours as needed for headache. 05/18/19  Yes Georgette Shell, MD  alum & mag hydroxide-simeth (MAALOX/MYLANTA) 200-200-20 MG/5ML suspension Take 30 mLs by mouth every 6 (six) hours as needed for indigestion or heartburn. 05/18/19  Yes Georgette Shell, MD  famotidine (PEPCID) 20 MG tablet Take 1 tablet (20 mg total) by mouth 2 (two) times daily. 05/18/19 05/17/20 Yes Georgette Shell, MD  naproxen (NAPROSYN) 375 MG tablet Take 1 tablet (375 mg total) by mouth 2 (two) times daily. 06/22/19   Volanda Napoleon, PA-C  nicotine (NICODERM CQ - DOSED IN MG/24 HOURS) 21 mg/24hr patch Place 1 patch (21 mg total) onto the skin daily. Patient not taking: Reported on 06/22/2019 05/19/19   Georgette Shell, MD  ondansetron (ZOFRAN ODT) 4 MG disintegrating tablet Take 1 tablet (4 mg total) by mouth every 8 (eight) hours as needed for nausea or vomiting. 06/22/19   Volanda Napoleon, PA-C  potassium chloride (KLOR-CON) 10 MEQ tablet Take 2 tablets (20 mEq total) by mouth daily. 05/18/19   Georgette Shell, MD    Allergies    Patient has no known allergies.  Review of Systems   Review of Systems  Constitutional: Negative for fever.  Respiratory: Negative for cough and shortness of breath.   Cardiovascular: Negative  for chest pain.  Gastrointestinal: Positive for abdominal pain, nausea and vomiting.  Genitourinary: Positive for scrotal swelling and testicular pain. Negative for discharge, dysuria, hematuria and penile pain.  Neurological: Positive for light-headedness. Negative for headaches.  All other systems reviewed and are negative.   Physical Exam Updated Vital Signs BP 118/68   Pulse 63   Temp 98 F (36.7 C) (Oral)   Resp 18   SpO2 99%   Physical Exam Vitals and nursing note reviewed. Exam conducted with a chaperone present.  Constitutional:      Appearance: Normal appearance. He is well-developed.     Comments: Appears uncomfortable   HENT:     Head: Normocephalic  and atraumatic.  Eyes:     General: Lids are normal.     Conjunctiva/sclera: Conjunctivae normal.     Pupils: Pupils are equal, round, and reactive to light.  Cardiovascular:     Rate and Rhythm: Normal rate and regular rhythm.     Pulses: Normal pulses.     Heart sounds: Normal heart sounds. No murmur. No friction rub. No gallop.   Pulmonary:     Effort: Pulmonary effort is normal.     Breath sounds: Normal breath sounds.     Comments: Lungs clear to auscultation bilaterally.  Symmetric chest rise.  No wheezing, rales, rhonchi. Abdominal:     Palpations: Abdomen is soft. Abdomen is not rigid.     Tenderness: There is abdominal tenderness in the suprapubic area. There is no guarding.     Hernia: There is no hernia in the left inguinal area or right inguinal area.  Genitourinary:    Penis: Normal. No discharge.      Testes:        Right: Tenderness present. Swelling not present.        Left: Tenderness present. Swelling not present.     Comments: The exam was performed with a chaperone present. Normal male genitalia. No evidence of rash, ulcers or lesions.  Left testicle is swollen and tender.  No overlying warmth, erythema.  Unable to elicit cremasteric reflex.  Mild tenderness noted to the right with no obvious swelling or erythema.  No palpable mass.  Cremasteric reflex present on the right.  No inguinal hernia noted.  No penile rash, lesion.  No discharge from penis. Musculoskeletal:        General: Normal range of motion.     Cervical back: Full passive range of motion without pain.  Skin:    General: Skin is warm and dry.     Capillary Refill: Capillary refill takes less than 2 seconds.  Neurological:     Mental Status: He is alert and oriented to person, place, and time.  Psychiatric:        Speech: Speech normal.     ED Results / Procedures / Treatments   Labs (all labs ordered are listed, but only abnormal results are displayed) Labs Reviewed  COMPREHENSIVE METABOLIC  PANEL - Abnormal; Notable for the following components:      Result Value   Glucose, Bld 104 (*)    Albumin 5.3 (*)    All other components within normal limits  CBC WITH DIFFERENTIAL/PLATELET - Abnormal; Notable for the following components:   WBC 13.4 (*)    Neutro Abs 10.9 (*)    All other components within normal limits  URINALYSIS, ROUTINE W REFLEX MICROSCOPIC - Abnormal; Notable for the following components:   Specific Gravity, Urine <1.005 (*)    Ketones, ur  80 (*)    Protein, ur 30 (*)    All other components within normal limits  RESPIRATORY PANEL BY RT PCR (FLU A&B, COVID)  LIPASE, BLOOD    EKG None  Radiology CT ABDOMEN PELVIS W CONTRAST  Result Date: 06/22/2019 CLINICAL DATA:  Abdominal pain. Testicular pain and swelling. Pain is worse on the left. EXAM: CT ABDOMEN AND PELVIS WITH CONTRAST TECHNIQUE: Multidetector CT imaging of the abdomen and pelvis was performed using the standard protocol following bolus administration of intravenous contrast. CONTRAST:  OMNIPAQUE IOHEXOL 300 MG/ML  SOLN COMPARISON:  May 16, 2019 FINDINGS: Lower chest: The lung bases are clear. The heart size is normal. Hepatobiliary: The liver is normal. Normal gallbladder.There is no biliary ductal dilation. Pancreas: Normal contours without ductal dilatation. No peripancreatic fluid collection. Spleen: Unremarkable. Adrenals/Urinary Tract: --Adrenal glands: Unremarkable. 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 --Left kidney/ureter: The left kidney is slightly malrotated without evidence for hydronephrosis. --Urinary bladder: The bladder is decompressed which limits evaluation. Stomach/Bowel: --Stomach/Duodenum: No hiatal hernia or other gastric abnormality. Normal duodenal course and caliber. --Small bowel: Unremarkable. --Colon: Unremarkable. --Appendix: Normal. Vascular/Lymphatic: Normal course and caliber of the major  abdominal vessels. --No retroperitoneal lymphadenopathy. --No mesenteric lymphadenopathy. --No pelvic or inguinal lymphadenopathy. Reproductive: There is a cystic lesions visualized within the left hemiscrotum, better characterized on the patient's prior ultrasound. Other: No ascites or free air. The abdominal wall is normal. Musculoskeletal. No acute displaced fractures. IMPRESSION: No acute abdominopelvic abnormality. Electronically Signed   By: Katherine Mantle M.D.   On: 06/22/2019 19:11   US SCROTUM W/DOPPLER  Result Date: 06/22/2019 CLINICAL DATA:  Testicular pain for 1 month EXAM: SCROTAL ULTRASOUND DOPPLER ULTRASOUND OF THE TESTICLES TECHNIQUE: Complete ultrasound examination of the testicles, epididymis, and other scrotal structures was performed. Color and spectral Doppler ultrasound were also utilized to evaluate blood flow to the testicles. COMPARISON:  05/16/2019 FINDINGS: Right testicle Measurements: 4.0 x 2.2 x 2.9 cm. No mass or microlithiasis visualized. Left testicle Measurements: 4.2 x 2.6 x 3.2 cm. No mass or microlithiasis visualized. Right epididymis:  Normal in size and appearance. Left epididymis: Left epididymal cysts or loculated hydrocele components measuring up to 3.8 cm. Hydrocele:  Left hydrocele. Varicocele:  Small left varicocele. Pulsed Doppler interrogation of both testes demonstrates normal low resistance arterial and venous waveforms bilaterally. IMPRESSION: 1. Normal ultrasound appearance of the bilateral testicles. Arterial and venous Doppler flow is present bilaterally. 2. There is a loculated left hydrocele versus sizable epididymal cysts, similar in appearance to prior examination. Electronically Signed   By: Lauralyn Primes M.D.   On: 06/22/2019 17:33    Procedures Procedures (including critical care time)  Medications Ordered in ED Medications  ondansetron (ZOFRAN) injection 4 mg (4 mg Intravenous Given 06/22/19 1600)  morphine 4 MG/ML injection 4 mg (4 mg  Intravenous Given 06/22/19 1600)  ondansetron (ZOFRAN) injection 4 mg (4 mg Intravenous Given 06/22/19 1827)  fentaNYL (SUBLIMAZE) injection 100 mcg (100 mcg Intravenous Given 06/22/19 1827)  iohexol (OMNIPAQUE) 300 MG/ML solution 100 mL (100 mLs Intravenous Contrast Given 06/22/19 1848)  sodium chloride (PF) 0.9 % injection (  Given by Other 06/22/19 2000)    ED Course  I have reviewed the triage vital signs and the nursing notes.  Pertinent labs & imaging results that were available during my care of the patient were reviewed by me and considered in my medical decision making (see chart for details).  MDM Rules/Calculators/A&P                      29 year old male who presents for evaluation of left-sided testicle pain that began this morning.  He reports that he always has intermittent pain in his testicle and states that he felt like he had a third testicle.  He reports that this morning, he started having pain and swelling in his left testicle.  No fevers.  But he has had some nausea/vomiting.  On initially arrival, he is uncomfortable but afebrile, nontoxic appearing.  Vital signs are stable.  On exam, he has tenderness and swelling in left testicle.  Unable to elicit cremasteric reflex on the left testicle.  No palpable hernia, mass.  Plan for scrotal ultrasound, labs.  Lipase is unremarkable.  CBC shows leukocytosis of 13.4.  CMP shows BUN and creatinine normal.  Ultrasound shows no evidence of testicular torsion.  There is a loculated left hydrocele versus sizable epididymal cyst noted in the left testicle.  This appears similar in prior examination.  Discussed results with patient.  He is still reporting significant pain and had another episode of vomiting here in the ED.  Repeat abdominal exam shows he is still tender in his abdomen.  We will plan for additional antiemetics and CT imaging.   CT on pelvis shows no acute intra-abdominal pathology.  UA negative for any infectious  etiology.  Patient able tolerate p.o. without any difficulty.  He has not had any vomiting.  Repeat abdominal exam shows improved tenderness.  He has been resting comfortably for the last several hours.  He is hemodynamically stable.  Instructed patient that he will need to follow-up with urology regarding the left testicular cyst.  Encouraged at home supportive care measures. At this time, patient exhibits no emergent life-threatening condition that require further evaluation in ED or admission. Patient had ample opportunity for questions and discussion. All patient's questions were answered with full understanding. Strict return precautions discussed. Patient expresses understanding and agreement to plan.   Portions of this note were generated with Scientist, clinical (histocompatibility and immunogenetics). Dictation errors may occur despite best attempts at proofreading.   Final Clinical Impression(s) / ED Diagnoses Final diagnoses:  Pain in left testicle  Epididymal cyst    Rx / DC Orders ED Discharge Orders         Ordered    naproxen (NAPROSYN) 375 MG tablet  2 times daily     06/22/19 2237    ondansetron (ZOFRAN ODT) 4 MG disintegrating tablet  Every 8 hours PRN     06/22/19 2237           Maxwell Caul, PA-C 06/22/19 2353    Virgina Norfolk, DO 06/23/19 2318

## 2019-06-22 NOTE — ED Notes (Signed)
Pt aware urine sample needed 

## 2019-06-23 ENCOUNTER — Other Ambulatory Visit: Payer: Self-pay

## 2019-06-23 ENCOUNTER — Emergency Department (HOSPITAL_COMMUNITY)
Admission: EM | Admit: 2019-06-23 | Discharge: 2019-06-23 | Disposition: A | Discharge status: Home / Self Care | Payer: Medicaid - Out of State | Attending: Emergency Medicine | Admitting: Emergency Medicine

## 2019-06-23 ENCOUNTER — Encounter (HOSPITAL_COMMUNITY): Payer: Self-pay | Admitting: Emergency Medicine

## 2019-06-23 DIAGNOSIS — N433 Hydrocele, unspecified: Secondary | ICD-10-CM | POA: Insufficient documentation

## 2019-06-23 DIAGNOSIS — F12188 Cannabis abuse with other cannabis-induced disorder: Secondary | ICD-10-CM | POA: Insufficient documentation

## 2019-06-23 DIAGNOSIS — F17200 Nicotine dependence, unspecified, uncomplicated: Secondary | ICD-10-CM | POA: Diagnosis not present

## 2019-06-23 DIAGNOSIS — R112 Nausea with vomiting, unspecified: Secondary | ICD-10-CM | POA: Insufficient documentation

## 2019-06-23 DIAGNOSIS — Z79899 Other long term (current) drug therapy: Secondary | ICD-10-CM | POA: Diagnosis not present

## 2019-06-23 DIAGNOSIS — R064 Hyperventilation: Secondary | ICD-10-CM | POA: Insufficient documentation

## 2019-06-23 DIAGNOSIS — R109 Unspecified abdominal pain: Secondary | ICD-10-CM | POA: Diagnosis present

## 2019-06-23 LAB — COMPREHENSIVE METABOLIC PANEL
ALT: 20 U/L (ref 0–44)
AST: 34 U/L (ref 15–41)
Albumin: 5.4 g/dL — ABNORMAL HIGH (ref 3.5–5.0)
Alkaline Phosphatase: 51 U/L (ref 38–126)
Anion gap: 17 — ABNORMAL HIGH (ref 5–15)
BUN: 10 mg/dL (ref 6–20)
CO2: 17 mmol/L — ABNORMAL LOW (ref 22–32)
Calcium: 10.1 mg/dL (ref 8.9–10.3)
Chloride: 105 mmol/L (ref 98–111)
Creatinine, Ser: 1.17 mg/dL (ref 0.61–1.24)
GFR calc Af Amer: >60 mL/min (ref >60)
GFR calc non Af Amer: >60 mL/min (ref >60)
Glucose, Bld: 107 mg/dL — ABNORMAL HIGH (ref 70–99)
Potassium: 4 mmol/L (ref 3.5–5.1)
Sodium: 139 mmol/L (ref 135–145)
Total Bilirubin: 1 mg/dL (ref 0.3–1.2)
Total Protein: 8.3 g/dL — ABNORMAL HIGH (ref 6.5–8.1)

## 2019-06-23 LAB — CBC
HCT: 46.1 % (ref 39.0–52.0)
Hemoglobin: 16.4 g/dL (ref 13.0–17.0)
MCH: 32.4 pg (ref 26.0–34.0)
MCHC: 35.6 g/dL (ref 30.0–36.0)
MCV: 91.1 fL (ref 80.0–100.0)
Platelets: 201 10*3/uL (ref 150–400)
RBC: 5.06 MIL/uL (ref 4.22–5.81)
RDW: 12.8 % (ref 11.5–15.5)
WBC: 13.5 10*3/uL — ABNORMAL HIGH (ref 4.0–10.5)
nRBC: 0 % (ref 0.0–0.2)

## 2019-06-23 LAB — LIPASE, BLOOD: Lipase: 18 U/L (ref 11–51)

## 2019-06-23 MED ORDER — PROCHLORPERAZINE MALEATE 10 MG PO TABS: 10.0000 mg | ORAL_TABLET | Freq: Two times a day (BID) | ORAL | 0 refills | Status: DC | PRN

## 2019-06-23 MED ORDER — LORAZEPAM 2 MG/ML IJ SOLN
1.0000 mg | Freq: Once | INTRAMUSCULAR | Status: AC
  Administered 2019-06-23: 1 mg via INTRAVENOUS
  Filled 2019-06-23: qty 1

## 2019-06-23 MED ORDER — SODIUM CHLORIDE 0.9% FLUSH: 3.0000 mL | Freq: Once | INTRAVENOUS | Status: DC

## 2019-06-23 MED ORDER — SODIUM CHLORIDE 0.9 % IV BOLUS
500.0000 mL | Freq: Once | INTRAVENOUS | Status: AC
  Administered 2019-06-23: 500 mL via INTRAVENOUS

## 2019-06-23 MED ORDER — HALOPERIDOL LACTATE 5 MG/ML IJ SOLN
2.0000 mg | Freq: Once | INTRAMUSCULAR | Status: AC
  Administered 2019-06-23: 2 mg via INTRAVENOUS
  Filled 2019-06-23: qty 1

## 2019-06-23 MED ORDER — ONDANSETRON HCL 4 MG/2ML IJ SOLN
4.0000 mg | Freq: Once | INTRAMUSCULAR | Status: AC
  Administered 2019-06-23: 4 mg via INTRAVENOUS
  Filled 2019-06-23: qty 2

## 2019-06-23 NOTE — Discharge Instructions (Addendum)
Please follow up with the urologist with the number given to you yesterday.  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  Please STOP smoking marijuana - this may be triggering your vomiting.   You should stick to fluids and soft foods like yogurt for the next 2-3 days, avoiding solid foods.  Solid, heavy, or spicy foods and meats are likely to trigger your vomiting again.  Give your stomach and bowels a rest.

## 2019-06-23 NOTE — ED Triage Notes (Signed)
Pt c/o abd pains and continued testicle pain. Was seen here yesterday for the same. Also having vomiting.

## 2019-06-23 NOTE — ED Provider Notes (Signed)
[facility]-EMERGENCY DEPT Provider Note  CSN: [id number] Arrival date & time: 06/23/19  1252    History Chief Complaint  Patient presents with  . Abdominal Pain  . Testicle Pain    Logan Cooper is a 29 y.o. male.  With a history of anxiety, acid reflux, chronic testicular pain, presented to emergency department complaining of testicular pain and abdominal pain.  Patient was seen in our emergency department less than 24 hours ago with identical complaints.  He had an ultrasound of the scrotum performed that time which showed no evidence of torsion or infection but noted epididymal cyst.  He also a CT scan of the abdomen which was benign and showed no infectious or inflammatory process.  He was treated with medications and able to tolerate p.o. and discharged home.  He went home afterwards feeling better and reports he smoked marijuana and ate 2 corndogs, then became sick again with vomiting. Returns today complaining of continued nausea or vomiting.  On my initial exam the patient is hyperventilating and appears extremely anxious.  I have a difficult time getting him to explain to me what is going on.  He says " I want you to cut it all out of me, the bad stuff making me sick."  He does tell me that he continues to smoke marijuana daily.  He says he has not been able to make an appoint with a urologist for his testicular pain.  He has had this pain since he was a teenager.  He was also admitted in March 2021 for abdominal pain and testicular pain after negative CT abd and scrotal ultrasound at that time.  He was admitted for anti-emetics.    He has a hx of refractory n/v and hypokalemia in the past.  This was felt to be 2/2 cannabinoid ingestion.  HPI     Past Medical History:  Diagnosis Date  . Acid reflux   . Anxiety     Patient Active Problem List   Diagnosis Date Noted  . Cyclical vomiting   . Chest wall pain   . Refractory nausea and  vomiting 05/17/2019  . Hypokalemia 05/17/2019  . GERD (gastroesophageal reflux disease) 05/17/2019    Past Surgical History:  Procedure Laterality Date  . HERNIA REPAIR         No family history on file.  Social History   Tobacco Use  . Smoking status: Current Every Day Smoker  . Smokeless tobacco: Never Used  Substance Use Topics  . Alcohol use: Yes  . Drug use: Yes    Types: Marijuana    Home Medications Prior to Admission medications   Medication Sig Start Date End Date Taking? Authorizing Provider  acetaminophen (TYLENOL) 325 MG tablet Take 2 tablets (650 mg total) by mouth every 6 (six) hours as needed for headache. 05/18/19   Alwyn Ren, MD  alum & mag hydroxide-simeth (MAALOX/MYLANTA) 200-200-20 MG/5ML suspension Take 30 mLs by mouth every 6 (six) hours as needed for indigestion or heartburn. 05/18/19   Alwyn Ren, MD  famotidine (PEPCID) 20 MG tablet Take 1 tablet (20 mg total) by mouth 2 (two) times daily. 05/18/19 05/17/20  Alwyn Ren, MD  naproxen (NAPROSYN) 375 MG tablet Take 1 tablet (375 mg total) by mouth 2 (two) times daily. 06/22/19   Maxwell Caul, PA-C  nicotine (NICODERM CQ - DOSED IN MG/24 HOURS) 21 mg/24hr patch Place 1 patch (21 mg total) onto the skin daily. Patient not  taking: Reported on 06/22/2019 05/19/19   Alwyn Ren, MD  ondansetron (ZOFRAN ODT) 4 MG disintegrating tablet Take 1 tablet (4 mg total) by mouth every 8 (eight) hours as needed for nausea or vomiting. 06/22/19   Maxwell Caul, PA-C  potassium chloride (KLOR-CON) 10 MEQ tablet Take 2 tablets (20 mEq total) by mouth daily. 05/18/19   Alwyn Ren, MD  prochlorperazine (COMPAZINE) 10 MG tablet Take 1 tablet (10 mg total) by mouth 2 (two) times daily as needed for up to 10 doses for nausea or vomiting. 06/23/19   Terald Sleeper, MD    Allergies    Patient has no known allergies.  Review of Systems   Review of Systems  Constitutional: Negative  for chills and fever.  HENT: Negative for ear pain and sore throat.   Eyes: Negative for photophobia and visual disturbance.  Respiratory: Positive for shortness of breath. Negative for cough.   Cardiovascular: Negative for chest pain and palpitations.  Gastrointestinal: Positive for abdominal pain, nausea and vomiting.  Genitourinary: Positive for testicular pain. Negative for dysuria.  Musculoskeletal: Negative for arthralgias and back pain.  Skin: Negative for color change and rash.  Neurological: Positive for light-headedness. Negative for syncope.  All other systems reviewed and are negative.   Physical Exam Updated Vital Signs BP 110/65   Pulse 76   Temp 98 F (36.7 C) (Oral)   Resp 15   SpO2 98%   Physical Exam Vitals and nursing note reviewed.  Constitutional:      Appearance: He is well-developed.     Comments: Hyperventilation, mumbling on arrival  HENT:     Head: Normocephalic and atraumatic.  Eyes:     Conjunctiva/sclera: Conjunctivae normal.  Cardiovascular:     Rate and Rhythm: Normal rate and regular rhythm.     Heart sounds: Normal heart sounds.  Pulmonary:     Effort: Pulmonary effort is normal. No respiratory distress.     Breath sounds: Normal breath sounds.  Abdominal:     General: Abdomen is flat.     Palpations: Abdomen is soft.     Tenderness: There is no abdominal tenderness. There is no guarding or rebound.  Musculoskeletal:     Cervical back: Neck supple.  Skin:    General: Skin is warm and dry.  Neurological:     General: No focal deficit present.     Mental Status: He is alert and oriented to person, place, and time.     ED Results / Procedures / Treatments   Labs (all labs ordered are listed, but only abnormal results are displayed) Labs Reviewed  COMPREHENSIVE METABOLIC PANEL - Abnormal; Notable for the following components:      Result Value   CO2 17 (*)    Glucose, Bld 107 (*)    Total Protein 8.3 (*)    Albumin 5.4 (*)     Anion gap 17 (*)    All other components within normal limits  CBC - Abnormal; Notable for the following components:   WBC 13.5 (*)    All other components within normal limits  LIPASE, BLOOD    EKG None  Radiology CT ABDOMEN PELVIS W CONTRAST  Result Date: 06/22/2019 CLINICAL DATA:  Abdominal pain. Testicular pain and swelling. Pain is worse on the left. EXAM: CT ABDOMEN AND PELVIS WITH CONTRAST TECHNIQUE: Multidetector CT imaging of the abdomen and pelvis was performed using the standard protocol following bolus administration of intravenous contrast. CONTRAST:  OMNIPAQUE IOHEXOL  300 MG/ML  SOLN COMPARISON:  May 16, 2019 FINDINGS: Lower chest: The lung bases are clear. The heart size is normal. Hepatobiliary: The liver is normal. Normal gallbladder.There is no biliary ductal dilation. Pancreas: Normal contours without ductal dilatation. No peripancreatic fluid collection. Spleen: Unremarkable. Adrenals/Urinary Tract: --Adrenal glands: Unremarkable. 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 --Left kidney/ureter: The left kidney is slightly malrotated without evidence for hydronephrosis. --Urinary bladder: The bladder is decompressed which limits evaluation. Stomach/Bowel: --Stomach/Duodenum: No hiatal hernia or other gastric abnormality. Normal duodenal course and caliber. --Small bowel: Unremarkable. --Colon: Unremarkable. --Appendix: Normal. Vascular/Lymphatic: Normal course and caliber of the major abdominal vessels. --No retroperitoneal lymphadenopathy. --No mesenteric lymphadenopathy. --No pelvic or inguinal lymphadenopathy. Reproductive: There is a cystic lesions visualized within the left hemiscrotum, better characterized on the patient's prior ultrasound. Other: No ascites or free air. The abdominal wall is normal. Musculoskeletal. No acute displaced fractures. IMPRESSION: No acute abdominopelvic abnormality.  Electronically Signed   By: Constance Holster M.D.   On: 06/22/2019 19:11   US SCROTUM W/DOPPLER  Result Date: 06/22/2019 CLINICAL DATA:  Testicular pain for 1 month EXAM: SCROTAL ULTRASOUND DOPPLER ULTRASOUND OF THE TESTICLES TECHNIQUE: Complete ultrasound examination of the testicles, epididymis, and other scrotal structures was performed. Color and spectral Doppler ultrasound were also utilized to evaluate blood flow to the testicles. COMPARISON:  05/16/2019 FINDINGS: Right testicle Measurements: 4.0 x 2.2 x 2.9 cm. No mass or microlithiasis visualized. Left testicle Measurements: 4.2 x 2.6 x 3.2 cm. No mass or microlithiasis visualized. Right epididymis:  Normal in size and appearance. Left epididymis: Left epididymal cysts or loculated hydrocele components measuring up to 3.8 cm. Hydrocele:  Left hydrocele. Varicocele:  Small left varicocele. Pulsed Doppler interrogation of both testes demonstrates normal low resistance arterial and venous waveforms bilaterally. IMPRESSION: 1. Normal ultrasound appearance of the bilateral testicles. Arterial and venous Doppler flow is present bilaterally. 2. There is a loculated left hydrocele versus sizable epididymal cysts, similar in appearance to prior examination. Electronically Signed   By: Eddie Candle M.D.   On: 06/22/2019 17:33    Procedures Procedures (including critical care time)  Medications Ordered in ED Medications  sodium chloride flush (NS) 0.9 % injection 3 mL (3 mLs Intravenous Not Given 06/23/19 1348)  haloperidol lactate (HALDOL) injection 2 mg (2 mg Intravenous Given 06/23/19 1348)  LORazepam (ATIVAN) injection 1 mg (1 mg Intravenous Given 06/23/19 1348)  ondansetron (ZOFRAN) injection 4 mg (4 mg Intravenous Given 06/23/19 1348)  sodium chloride 0.9 % bolus 500 mL (0 mLs Intravenous Stopped 06/23/19 1538)    ED Course  I have reviewed the triage vital signs and the nursing notes.  Pertinent labs & imaging results that were available during my  care of the patient were reviewed by me and considered in my medical decision making (see chart for details).  29 yo male with suspected cannabis hyperemesis syndrome and scrotal hydrocele presenting back to ED today with complaint of continued n/v and abdominal pain, and scrotal ache, after discharge from the ED yesterday with identical complaints.  He had a robust workup in the ER yesterday including CT abd/pelvis and scrotal ultrasound which was unremarkable aside from hydrocele.  He was given urology office number but has not yet made an appointment.  His symptoms had improved but he returned home and indulged in marijuana and corndogs, which I suspect provoked another round of hyperemesis.  We rechecked his labs today to evaluate for any  potential trends but his numbers are stable, including his BMP (and potassium), CBC (and WBC), and lipase.  I am doubtful of a new or worsening emergent abdominal process such as appendicitis or biliary disease.  I do not believe he requires repeat CT imaging of the abdomen.   I am likewise doubtful of torsion, cytitis, or GU infection with a negative workup less than 12 hours ago, and given that his testicular pain dates back to his teenage years.  I did explain to him that the mass he has felt in his scrotum is the hydrocele.  These are not typically painful, but they do swell, and may be causing some discomfort.  He can f/u with urology for this, although I don't think it's an emergent condition.  I also treated his hyperemesis and hyperventilation with haldol and ativan and zofran IV.  This dramatically helped.  On my reassessment, he was sitting upright, able to talk clearly to me, and drank an entire juice carton.  I brought his mother into the room and the 3 of [place] discussed his hyperemesis syndrome.  I strongly advised marijuana cessation.  I'll add compazine to his zofran home prescription for the next few days.  I also advised liquid and soft diet for the next  3 days, and easing back into solid foods.  No more corndogs.  They verbalized understanding, and he was medically stable for discharge.   Clinical Course as of Jun 22 1704  Sun Jun 23, 2019  1417 Labs stable from yesterday, minor anion gap likely from contraction alkolosis and vomiting, will give IVF here   [MT]  1510 Patient sitting up and able to drink juice and water, speaking clearly now, and feeling better   [MT]  1511 His mother was also updated on the plan at bedside.  Okay for discharge   [MT]    Clinical Course User Index [MT] Torie Priebe, Kermit Balo, MD    Final Clinical Impression(s) / ED Diagnoses Final diagnoses:  Cannabis hyperemesis syndrome concurrent with and due to cannabis abuse (HCC)  Nausea and vomiting, intractability of vomiting not specified, unspecified vomiting type  Hydrocele, unspecified hydrocele type    Rx / DC Orders ED Discharge Orders         Ordered    prochlorperazine (COMPAZINE) 10 MG tablet  2 times daily PRN     06/23/19 1528           Terald Sleeper, MD 06/23/19 1707

## 2019-06-26 ENCOUNTER — Encounter (HOSPITAL_COMMUNITY): Payer: Self-pay

## 2019-06-26 ENCOUNTER — Emergency Department (HOSPITAL_COMMUNITY)
Admission: EM | Admit: 2019-06-26 | Discharge: 2019-06-26 | Disposition: A | Discharge status: Home / Self Care | Payer: Medicaid - Out of State | Attending: Emergency Medicine | Admitting: Emergency Medicine

## 2019-06-26 ENCOUNTER — Other Ambulatory Visit: Payer: Self-pay

## 2019-06-26 DIAGNOSIS — Z79899 Other long term (current) drug therapy: Secondary | ICD-10-CM | POA: Diagnosis not present

## 2019-06-26 DIAGNOSIS — N50812 Left testicular pain: Secondary | ICD-10-CM | POA: Diagnosis present

## 2019-06-26 DIAGNOSIS — F17210 Nicotine dependence, cigarettes, uncomplicated: Secondary | ICD-10-CM | POA: Diagnosis not present

## 2019-06-26 LAB — BASIC METABOLIC PANEL
Anion gap: 10 (ref 5–15)
BUN: <5 mg/dL — ABNORMAL LOW (ref 6–20)
CO2: 27 mmol/L (ref 22–32)
Calcium: 9.6 mg/dL (ref 8.9–10.3)
Chloride: 107 mmol/L (ref 98–111)
Creatinine, Ser: 0.81 mg/dL (ref 0.61–1.24)
GFR calc Af Amer: >60 mL/min (ref >60)
GFR calc non Af Amer: >60 mL/min (ref >60)
Glucose, Bld: 105 mg/dL — ABNORMAL HIGH (ref 70–99)
Potassium: 3.8 mmol/L (ref 3.5–5.1)
Sodium: 144 mmol/L (ref 135–145)

## 2019-06-26 LAB — CBC WITH DIFFERENTIAL/PLATELET
Abs Immature Granulocytes: 0.02 10*3/uL (ref 0.00–0.07)
Basophils Absolute: 0.1 10*3/uL (ref 0.0–0.1)
Basophils Relative: 1 %
Eosinophils Absolute: 0 10*3/uL (ref 0.0–0.5)
Eosinophils Relative: 0 %
HCT: 43.4 % (ref 39.0–52.0)
Hemoglobin: 15.2 g/dL (ref 13.0–17.0)
Immature Granulocytes: 0 %
Lymphocytes Relative: 14 %
Lymphs Abs: 1.2 10*3/uL (ref 0.7–4.0)
MCH: 33.3 pg (ref 26.0–34.0)
MCHC: 35 g/dL (ref 30.0–36.0)
MCV: 95 fL (ref 80.0–100.0)
Monocytes Absolute: 0.4 10*3/uL (ref 0.1–1.0)
Monocytes Relative: 4 %
Neutro Abs: 7 10*3/uL (ref 1.7–7.7)
Neutrophils Relative %: 81 %
Platelets: 170 10*3/uL (ref 150–400)
RBC: 4.57 MIL/uL (ref 4.22–5.81)
RDW: 12.8 % (ref 11.5–15.5)
WBC: 8.7 10*3/uL (ref 4.0–10.5)
nRBC: 0 % (ref 0.0–0.2)

## 2019-06-26 LAB — URINALYSIS, ROUTINE W REFLEX MICROSCOPIC
Bilirubin Urine: NEGATIVE
Glucose, UA: NEGATIVE mg/dL
Hgb urine dipstick: NEGATIVE
Ketones, ur: NEGATIVE mg/dL
Leukocytes,Ua: NEGATIVE
Nitrite: NEGATIVE
Protein, ur: NEGATIVE mg/dL
Specific Gravity, Urine: 1.02 (ref 1.005–1.030)
pH: 5 (ref 5.0–8.0)

## 2019-06-26 MED ORDER — HYDROCODONE-ACETAMINOPHEN 5-325 MG PO TABS
1.0000 | ORAL_TABLET | Freq: Once | ORAL | Status: AC
  Administered 2019-06-26: 13:00:00 1 via ORAL
  Filled 2019-06-26: qty 1

## 2019-06-26 MED ORDER — KETOROLAC TROMETHAMINE 60 MG/2ML IM SOLN
60.0000 mg | Freq: Once | INTRAMUSCULAR | Status: AC
  Administered 2019-06-26: 60 mg via INTRAMUSCULAR
  Filled 2019-06-26: qty 2

## 2019-06-26 MED ORDER — DOXYCYCLINE HYCLATE 100 MG PO CAPS: 100.0000 mg | ORAL_CAPSULE | Freq: Two times a day (BID) | ORAL | 0 refills | Status: AC

## 2019-06-26 NOTE — ED Triage Notes (Addendum)
C/o abd pain and continued testicle pain.  Patient seen multiple times for same and was reffered to follow up with specialist for his cyst per pt. and that case management was suppose to help him set up a appointment but has not seen case manager per patient.    Reports worse pain with urination in testicles C/O Nausea   Pt reports he can not keep previous prescribed medication down. (Zofran and Naproxen)  Last BM today but small per patient.   A/oX4 Ambulatory in triage.

## 2019-06-26 NOTE — Discharge Instructions (Addendum)
Follow-up with [facility],   call them to make an appointment for next week

## 2019-06-26 NOTE — ED Provider Notes (Signed)
[facility]-EMERGENCY DEPT Provider Note   CSN: [id number] Arrival date & time: 06/26/19  1049     History Chief Complaint  Patient presents with  . Testicle Pain  . Nausea  . Abdominal Pain    [facility] is a 29 y.o. male.  Patient complains of left testicle pain.  Patient had an ultrasound and CT of the abdomen the other day testicle.  The history is provided by the patient. No language interpreter was used.  Testicle Pain This is a recurrent problem. The problem occurs constantly. The problem has not changed since onset.Associated symptoms include abdominal pain. Pertinent negatives include no chest pain and no headaches. Nothing aggravates the symptoms. Nothing relieves the symptoms. He has tried nothing for the symptoms. The treatment provided no relief.  Abdominal Pain Associated symptoms: no chest pain, no cough, no diarrhea, no fatigue and no hematuria        Past Medical History:  Diagnosis Date  . Acid reflux   . Anxiety     Patient Active Problem List   Diagnosis Date Noted  . Cyclical vomiting   . Chest wall pain   . Refractory nausea and vomiting 05/17/2019  . Hypokalemia 05/17/2019  . GERD (gastroesophageal reflux disease) 05/17/2019    Past Surgical History:  Procedure Laterality Date  . HERNIA REPAIR         History reviewed. No pertinent family history.  Social History   Tobacco Use  . Smoking status: Current Every Day Smoker  . Smokeless tobacco: Never Used  Substance Use Topics  . Alcohol use: Yes  . Drug use: Yes    Types: Marijuana    Home Medications Prior to Admission medications   Medication Sig Start Date End Date Taking? Authorizing Provider  acetaminophen (TYLENOL) 325 MG tablet Take 2 tablets (650 mg total) by mouth every 6 (six) hours as needed for headache. 05/18/19   Alwyn Ren, MD  alum & mag hydroxide-simeth (MAALOX/MYLANTA) 200-200-20 MG/5ML suspension Take 30 mLs by  mouth every 6 (six) hours as needed for indigestion or heartburn. 05/18/19   Alwyn Ren, MD  doxycycline (VIBRAMYCIN) 100 MG capsule Take 1 capsule (100 mg total) by mouth 2 (two) times daily. One po bid x 7 days 06/26/19   Bethann Berkshire, MD  famotidine (PEPCID) 20 MG tablet Take 1 tablet (20 mg total) by mouth 2 (two) times daily. 05/18/19 05/17/20  Alwyn Ren, MD  naproxen (NAPROSYN) 375 MG tablet Take 1 tablet (375 mg total) by mouth 2 (two) times daily. 06/22/19   Maxwell Caul, PA-C  nicotine (NICODERM CQ - DOSED IN MG/24 HOURS) 21 mg/24hr patch Place 1 patch (21 mg total) onto the skin daily. Patient not taking: Reported on 06/22/2019 05/19/19   Alwyn Ren, MD  ondansetron (ZOFRAN ODT) 4 MG disintegrating tablet Take 1 tablet (4 mg total) by mouth every 8 (eight) hours as needed for nausea or vomiting. 06/22/19   Maxwell Caul, PA-C  potassium chloride (KLOR-CON) 10 MEQ tablet Take 2 tablets (20 mEq total) by mouth daily. 05/18/19   Alwyn Ren, MD  prochlorperazine (COMPAZINE) 10 MG tablet Take 1 tablet (10 mg total) by mouth 2 (two) times daily as needed for up to 10 doses for nausea or vomiting. 06/23/19   Terald Sleeper, MD    Allergies    Patient has no known allergies.  Review of Systems   Review of Systems  Constitutional: Negative for appetite  change and fatigue.  HENT: Negative for congestion, ear discharge and sinus pressure.   Eyes: Negative for discharge.  Respiratory: Negative for cough.   Cardiovascular: Negative for chest pain.  Gastrointestinal: Positive for abdominal pain. Negative for diarrhea.  Genitourinary: Positive for testicular pain. Negative for frequency and hematuria.  Musculoskeletal: Negative for back pain.  Skin: Negative for rash.  Neurological: Negative for seizures and headaches.  Psychiatric/Behavioral: Negative for hallucinations.    Physical Exam Updated Vital Signs BP 117/78 (BP Location: Left Arm)   Pulse  73   Temp 99.2 F (37.3 C) (Oral)   Resp 16   Ht 5\' 9"  (1.753 m)   Wt 65.8 kg   SpO2 94%   BMI 21.41 kg/m   Physical Exam Vitals and nursing note reviewed.  Constitutional:      Appearance: He is well-developed.  HENT:     Head: Normocephalic.     Mouth/Throat:     Mouth: Mucous membranes are moist.  Eyes:     General: No scleral icterus.    Conjunctiva/sclera: Conjunctivae normal.  Neck:     Thyroid: No thyromegaly.  Cardiovascular:     Rate and Rhythm: Normal rate and regular rhythm.     Heart sounds: No murmur. No friction rub. No gallop.   Pulmonary:     Breath sounds: No stridor. No wheezing or rales.  Chest:     Chest wall: No tenderness.  Abdominal:     General: There is no distension.     Tenderness: There is no abdominal tenderness. There is no rebound.  Genitourinary:    Comments: Tenderness along left testicle Musculoskeletal:        General: Normal range of motion.     Cervical back: Neck supple.  Lymphadenopathy:     Cervical: No cervical adenopathy.  Skin:    Findings: No erythema or rash.  Neurological:     Mental Status: He is alert and oriented to person, place, and time.     Motor: No abnormal muscle tone.     Coordination: Coordination normal.  Psychiatric:        Behavior: Behavior normal.     ED Results / Procedures / Treatments   Labs (all labs ordered are listed, but only abnormal results are displayed) Labs Reviewed  BASIC METABOLIC PANEL - Abnormal; Notable for the following components:      Result Value   Glucose, Bld 105 (*)    BUN <5 (*)    All other components within normal limits  URINALYSIS, ROUTINE W REFLEX MICROSCOPIC  CBC WITH DIFFERENTIAL/PLATELET    EKG None  Radiology No results found.  Procedures Procedures (including critical care time)  Medications Ordered in ED Medications  HYDROcodone-acetaminophen (NORCO/VICODIN) 5-325 MG per tablet 1 tablet (has no administration in time range)  ketorolac (TORADOL)  injection 60 mg (60 mg Intramuscular Given 06/26/19 1211)    ED Course  I have reviewed the triage vital signs and the nursing notes.  Pertinent labs & imaging results that were available during my care of the patient were reviewed by me and considered in my medical decision making (see chart for details).    MDM Rules/Calculators/A&P                      Patient with cyst on his left testicle.  He was referred to urology before but has not seen them.  He will be referred again.  He is going to be given doxycycline empirically.  This patient presents to the ED for concern of testicular pain, this involves an extensive number of treatment options, and is a complaint that carries with it a high risk of complications and morbidity.  The differential diagnosis includes epididymitis testicular tumor torsion   Lab Tests:   I Ordered, reviewed, and interpreted labs, which included CBC chemistries unremarkable  Medicines ordered:   I ordered medication Toradol for pain  Imaging Studies ordered:  Additional history obtained:   Additional history obtained from old records and previous provider  Previous records obtained and reviewed   Consultations Obtained:  Reevaluation:  After the interventions stated above, I reevaluated the patient and found mildly improved Critical Interventions:  .   Final Clinical Impression(s) / ED Diagnoses Final diagnoses:  Testicular pain, left    Rx / DC Orders ED Discharge Orders         Ordered    doxycycline (VIBRAMYCIN) 100 MG capsule  2 times daily     06/26/19 1322           Milton Ferguson, MD 06/26/19 1326

## 2019-07-07 ENCOUNTER — Encounter (HOSPITAL_COMMUNITY): Payer: Self-pay

## 2019-07-07 ENCOUNTER — Other Ambulatory Visit: Payer: Self-pay

## 2019-07-07 ENCOUNTER — Emergency Department (HOSPITAL_COMMUNITY)
Admission: EM | Admit: 2019-07-07 | Discharge: 2019-07-07 | Disposition: A | Discharge status: Home / Self Care | Payer: Medicaid - Out of State | Attending: Emergency Medicine | Admitting: Emergency Medicine

## 2019-07-07 DIAGNOSIS — N50819 Testicular pain, unspecified: Secondary | ICD-10-CM

## 2019-07-07 DIAGNOSIS — N50812 Left testicular pain: Secondary | ICD-10-CM | POA: Insufficient documentation

## 2019-07-07 DIAGNOSIS — F17200 Nicotine dependence, unspecified, uncomplicated: Secondary | ICD-10-CM | POA: Diagnosis not present

## 2019-07-07 DIAGNOSIS — G8929 Other chronic pain: Secondary | ICD-10-CM | POA: Diagnosis not present

## 2019-07-07 DIAGNOSIS — R109 Unspecified abdominal pain: Secondary | ICD-10-CM | POA: Insufficient documentation

## 2019-07-07 HISTORY — DX: Benign cyst of testis: N44.2

## 2019-07-07 LAB — CBC WITH DIFFERENTIAL/PLATELET
Abs Immature Granulocytes: 0.03 10*3/uL (ref 0.00–0.07)
Basophils Absolute: 0.1 10*3/uL (ref 0.0–0.1)
Basophils Relative: 1 %
Eosinophils Absolute: 0.1 10*3/uL (ref 0.0–0.5)
Eosinophils Relative: 1 %
HCT: 45.8 % (ref 39.0–52.0)
Hemoglobin: 15.9 g/dL (ref 13.0–17.0)
Immature Granulocytes: 0 %
Lymphocytes Relative: 13 %
Lymphs Abs: 1.3 10*3/uL (ref 0.7–4.0)
MCH: 33.1 pg (ref 26.0–34.0)
MCHC: 34.7 g/dL (ref 30.0–36.0)
MCV: 95.2 fL (ref 80.0–100.0)
Monocytes Absolute: 0.5 10*3/uL (ref 0.1–1.0)
Monocytes Relative: 5 %
Neutro Abs: 8.4 10*3/uL — ABNORMAL HIGH (ref 1.7–7.7)
Neutrophils Relative %: 80 %
Platelets: 170 10*3/uL (ref 150–400)
RBC: 4.81 MIL/uL (ref 4.22–5.81)
RDW: 12.8 % (ref 11.5–15.5)
WBC: 10.3 10*3/uL (ref 4.0–10.5)
nRBC: 0 % (ref 0.0–0.2)

## 2019-07-07 LAB — URINALYSIS, ROUTINE W REFLEX MICROSCOPIC
Bilirubin Urine: NEGATIVE
Glucose, UA: NEGATIVE mg/dL
Ketones, ur: 5 mg/dL — AB
Leukocytes,Ua: NEGATIVE
Nitrite: NEGATIVE
Protein, ur: NEGATIVE mg/dL
Specific Gravity, Urine: 1.019 (ref 1.005–1.030)
pH: 5 (ref 5.0–8.0)

## 2019-07-07 LAB — LIPASE, BLOOD: Lipase: 26 U/L (ref 11–51)

## 2019-07-07 LAB — COMPREHENSIVE METABOLIC PANEL
ALT: 17 U/L (ref 0–44)
AST: 24 U/L (ref 15–41)
Albumin: 4.9 g/dL (ref 3.5–5.0)
Alkaline Phosphatase: 58 U/L (ref 38–126)
Anion gap: 8 (ref 5–15)
BUN: 7 mg/dL (ref 6–20)
CO2: 25 mmol/L (ref 22–32)
Calcium: 9.5 mg/dL (ref 8.9–10.3)
Chloride: 105 mmol/L (ref 98–111)
Creatinine, Ser: 0.82 mg/dL (ref 0.61–1.24)
GFR calc Af Amer: >60 mL/min (ref >60)
GFR calc non Af Amer: >60 mL/min (ref >60)
Glucose, Bld: 106 mg/dL — ABNORMAL HIGH (ref 70–99)
Potassium: 3.7 mmol/L (ref 3.5–5.1)
Sodium: 138 mmol/L (ref 135–145)
Total Bilirubin: 0.6 mg/dL (ref 0.3–1.2)
Total Protein: 7.6 g/dL (ref 6.5–8.1)

## 2019-07-07 LAB — RAPID URINE DRUG SCREEN, HOSP PERFORMED
Amphetamines: NOT DETECTED
Barbiturates: NOT DETECTED
Benzodiazepines: NOT DETECTED
Cocaine: NOT DETECTED
Opiates: NOT DETECTED
Tetrahydrocannabinol: POSITIVE — AB

## 2019-07-07 MED ORDER — ALUM & MAG HYDROXIDE-SIMETH 200-200-20 MG/5ML PO SUSP
30.0000 mL | Freq: Once | ORAL | Status: AC
  Administered 2019-07-07: 30 mL via ORAL
  Filled 2019-07-07: qty 30

## 2019-07-07 MED ORDER — LIDOCAINE VISCOUS HCL 2 % MT SOLN
15.0000 mL | Freq: Once | OROMUCOSAL | Status: AC
  Administered 2019-07-07: 15 mL via ORAL
  Filled 2019-07-07: qty 15

## 2019-07-07 MED ORDER — SODIUM CHLORIDE 0.9 % IV BOLUS
500.0000 mL | Freq: Once | INTRAVENOUS | Status: AC
  Administered 2019-07-07: 500 mL via INTRAVENOUS

## 2019-07-07 MED ORDER — ONDANSETRON HCL 4 MG/2ML IJ SOLN
4.0000 mg | Freq: Once | INTRAMUSCULAR | Status: AC
  Administered 2019-07-07: 4 mg via INTRAVENOUS
  Filled 2019-07-07: qty 2

## 2019-07-07 MED ORDER — DICYCLOMINE HCL 10 MG PO CAPS
10.0000 mg | ORAL_CAPSULE | Freq: Once | ORAL | Status: AC
  Administered 2019-07-07: 10 mg via ORAL
  Filled 2019-07-07: qty 1

## 2019-07-07 NOTE — ED Provider Notes (Signed)
[facility] EMERGENCY DEPARTMENT Provider Note   CSN: [id number] Arrival date & time: 07/07/19  4401     History Chief Complaint  Patient presents with  . Abdominal Pain    Logan Cooper is a 29 y.o. male who is well-known to the emergency department and presents today with complaint of abdominal pain and vomiting.  He has a history of recurrent, cyclical vomiting and reflux.  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  Patient states that he awoke this morning at 6 AM when his alarm went off, went to use the bathroom and had 1 episode of loose stool followed by one episode of vomiting and came directly here.  He has Zofran at home but did not take it.  He states that he is afraid that if he vomited up the Zofran he was still have some in his system and was not sure that he could take it again.  He also complains of his testicle hurting.  This is unchanged from previous.  He states that anytime he bears down or strains he has pain that radiates from his stomach into his left testicle.  He has been unable to follow-up with urology because the cost of the visit is $250.  He also has been taking Pepcid twice daily for his reflux symptoms and states that is symptoms have improved taking the medication.  He has been compliant with taking the medication.  He currently describes his pain as burning, rating from his epigastrium into his left testicle.  Left testicle pain is unchanged.  His pain is currently moderate.  His nausea is currently moderate.  His pain is worsened with bearing down.  He denies fever, chills.  HPI     Past Medical History:  Diagnosis Date  . Acid reflux   . Anxiety   . Cyst of testis     Patient Active Problem List   Diagnosis Date Noted  . Cyclical vomiting   . Chest wall pain   . Refractory nausea and vomiting 05/17/2019  . Hypokalemia 05/17/2019  . GERD  (gastroesophageal reflux disease) 05/17/2019    Past Surgical History:  Procedure Laterality Date  . HERNIA REPAIR         No family history on file.  Social History   Tobacco Use  . Smoking status: Current Every Day Smoker  . Smokeless tobacco: Never Used  Substance Use Topics  . Alcohol use: Yes  . Drug use: Yes    Types: Marijuana    Home Medications Prior to Admission medications   Medication Sig Start Date End Date Taking? Authorizing Provider  acetaminophen (TYLENOL) 325 MG tablet Take 2 tablets (650 mg total) by mouth every 6 (six) hours as needed for headache. 05/18/19   Georgette Shell, MD  alum & mag hydroxide-simeth (MAALOX/MYLANTA) 200-200-20 MG/5ML suspension Take 30 mLs by mouth every 6 (six) hours as needed for indigestion or heartburn. 05/18/19   Georgette Shell, MD  doxycycline (VIBRAMYCIN) 100 MG capsule Take 1 capsule (100 mg total) by mouth 2 (two) times daily. One po bid x 7 days 06/26/19   Milton Ferguson, MD  famotidine (PEPCID) 20 MG tablet Take 1 tablet (20 mg total) by mouth 2 (two) times daily. 05/18/19 05/17/20  Georgette Shell, MD  naproxen (NAPROSYN) 375 MG tablet Take 1 tablet (375 mg total) by mouth 2 (two) times daily. 06/22/19   Layden,  Lindsey A, PA-C  nicotine (NICODERM CQ - DOSED IN MG/24 HOURS) 21 mg/24hr patch Place 1 patch (21 mg total) onto the skin daily. Patient not taking: Reported on 06/22/2019 05/19/19   Alwyn Ren, MD  ondansetron (ZOFRAN ODT) 4 MG disintegrating tablet Take 1 tablet (4 mg total) by mouth every 8 (eight) hours as needed for nausea or vomiting. 06/22/19   Maxwell Caul, PA-C  potassium chloride (KLOR-CON) 10 MEQ tablet Take 2 tablets (20 mEq total) by mouth daily. 05/18/19   Alwyn Ren, MD  prochlorperazine (COMPAZINE) 10 MG tablet Take 1 tablet (10 mg total) by mouth 2 (two) times daily as needed for up to 10 doses for nausea or vomiting. 06/23/19   Terald Sleeper, MD    Allergies      Patient has no known allergies.  Review of Systems   Review of Systems Ten systems reviewed and are negative for acute change, except as noted in the HPI.   Physical Exam Updated Vital Signs BP 120/82 (BP Location: Left Arm)   Pulse 72   Temp 98.7 F (37.1 C) (Oral)   Resp 16   Ht 5\' 9"  (1.753 m)   Wt 65 kg   SpO2 98%   BMI 21.16 kg/m   Physical Exam Vitals and nursing note reviewed. Exam conducted with a chaperone present.  Constitutional:      General: He is not in acute distress.    Appearance: He is well-developed. He is not diaphoretic.  HENT:     Head: Normocephalic and atraumatic.  Eyes:     General: No scleral icterus.    Conjunctiva/sclera: Conjunctivae normal.  Cardiovascular:     Rate and Rhythm: Normal rate and regular rhythm.     Heart sounds: Normal heart sounds.  Pulmonary:     Effort: Pulmonary effort is normal. No respiratory distress.     Breath sounds: Normal breath sounds.  Abdominal:     Palpations: Abdomen is soft.     Tenderness: There is abdominal tenderness.     Hernia: No hernia is present. There is no hernia in the left inguinal area or right inguinal area.     Comments: generalized tenderness. Reports pressure makes his testicle hurt  Genitourinary:    Pubic Area: No rash.      Penis: Normal and circumcised.      Testes: Cremasteric reflex is present.        Left: Tenderness, swelling and testicular hydrocele present.  Musculoskeletal:     Cervical back: Normal range of motion and neck supple.  Lymphadenopathy:     Lower Body: No right inguinal adenopathy. No left inguinal adenopathy.  Skin:    General: Skin is warm and dry.  Neurological:     Mental Status: He is alert.  Psychiatric:        Behavior: Behavior normal.     ED Results / Procedures / Treatments   Labs (all labs ordered are listed, but only abnormal results are displayed) Labs Reviewed  CBC WITH DIFFERENTIAL/PLATELET  COMPREHENSIVE METABOLIC PANEL  LIPASE,  BLOOD  URINALYSIS, ROUTINE W REFLEX MICROSCOPIC  RAPID URINE DRUG SCREEN, HOSP PERFORMED    EKG None  Radiology No results found.  Procedures Procedures (including critical care time)  Medications Ordered in ED Medications - No data to display  ED Course  I have reviewed the triage vital signs and the nursing notes.  Pertinent labs & imaging results that were available during my care of the patient  were reviewed by me and considered in my medical decision making (see chart for details).  Clinical Course as of Jul 07 1054  Sun Jul 07, 2019  5701 Patient here with recurrent N/V/AP and Testicle pain. Hx of cyclic vomiting.  No home interventions. The differential diagnosis for generalized abdominal pain includes, but is not limited to AAA, gastroenteritis, appendicitis, Bowel obstruction, Bowel perforation. Gastroparesis, DKA, Hernia, Inflammatory bowel disease, mesenteric ischemia, pancreatitis, peritonitis SBP, volvulus. Low suspicion of torsion or epididymitis/orchitis    [AH]  1025 Tetrahydrocannabinol(!): POSITIVE [AH]    Clinical Course User Index [AH] Arthor Captain, PA-C   MDM Rules/Calculators/A&P                      29 year old male here with abdominal pain, one episode of vomiting this morning, chronic testicle and abdominal pain present.  This is unchanged.  Given his multiple previous visits, unchanged pain I do not feel that he needs emergent evaluation of his testicle pain.  Previous imaging shows stable loculated hydrocele in the left testicle.  Patient's work-up today shows contaminated urine, UDS again positive for marijuana, CBC without elevated white blood cell count.  CMP with mild Lee elevated blood sugar of insignificant value.  Patient has had no active vomiting in the emergency department today.  He is afebrile and hemodynamically stable.  He is encouraged to continue taking his Pepcid, have given her explicit instructions on how to use his Zofran if he  vomits.  I have also discussed his need to avoid marijuana, greasy or fatty foods, spicy foods or caffeine to reduce gastritis symptoms.  Patient does not appear to have any emergent cause of his 1 episode of vomiting and chronic abdominal pain and testicle pain today.  Patient appears otherwise appropriate for discharge at this time. Final Clinical Impression(s) / ED Diagnoses Final diagnoses:  None    Rx / DC Orders ED Discharge Orders    None       Arthor Captain, PA-C 07/07/19 1058    Vanetta Mulders, MD 07/11/19 [phone]

## 2019-07-07 NOTE — ED Triage Notes (Signed)
Pt reports he woke up at 0600 with abdominal pain, nausea and diarrhea. Pt had one episode of vomiting and 2 of diarrhea. Pt does reports swollen left testicle with a cyst in it

## 2019-07-07 NOTE — Discharge Instructions (Addendum)
Please continue to take your pepcid daily. Avoid Marijuana, Spicy foods, Greasy/fatty foods, and caffeine. TAKE YOUR ANTI-NAUSEA MEDICATION FIRST BEFORE YOU SEEK EMERGENCY CARE! If you vomit you may REPEAT THE DOSE ONCE. If you continue to vomit and cannot hold anything down, seek emergency care. PLEASE FOLLOW UP WITH UROLOGY. ER's deal with life, limb and organ threatening emergencies and unfortunately there is not much we can do for your hydrocele.   If you have sudden worsening changes in your testicle, please come to the ER for that. Abdominal (belly) pain can be caused by many things. Your caregiver performed an examination and possibly ordered blood/urine tests and imaging (CT scan, x-rays, ultrasound). Many cases can be observed and treated at home after initial evaluation in the emergency department. Even though you are being discharged home, abdominal pain can be unpredictable. Therefore, you need a repeated exam if your pain does not resolve, returns, or worsens. Most patients with abdominal pain don't have to be admitted to the hospital or have surgery, but serious problems like appendicitis and gallbladder attacks can start out as nonspecific pain. Many abdominal conditions cannot be diagnosed in one visit, so follow-up evaluations are very important. SEEK IMMEDIATE MEDICAL ATTENTION IF: The pain does not go away or becomes severe.  A temperature above 101 develops.  Repeated vomiting occurs (multiple episodes).  The pain becomes localized to portions of the abdomen. The right side could possibly be appendicitis. In an adult, the left lower portion of the abdomen could be colitis or diverticulitis.  Blood is being passed in stools or vomit (bright red or black tarry stools).  Return also if you develop chest pain, difficulty breathing, dizziness or fainting, or become confused, poorly responsive, or inconsolable (young children).

## 2019-07-22 ENCOUNTER — Emergency Department (HOSPITAL_COMMUNITY)
Admission: EM | Admit: 2019-07-22 | Discharge: 2019-07-22 | Disposition: A | Discharge status: Home / Self Care | Payer: Medicaid - Out of State | Attending: Emergency Medicine | Admitting: Emergency Medicine

## 2019-07-22 ENCOUNTER — Other Ambulatory Visit: Payer: Self-pay

## 2019-07-22 ENCOUNTER — Encounter (HOSPITAL_COMMUNITY): Payer: Self-pay | Admitting: Emergency Medicine

## 2019-07-22 DIAGNOSIS — N433 Hydrocele, unspecified: Secondary | ICD-10-CM | POA: Insufficient documentation

## 2019-07-22 DIAGNOSIS — N50812 Left testicular pain: Secondary | ICD-10-CM | POA: Diagnosis present

## 2019-07-22 DIAGNOSIS — F17200 Nicotine dependence, unspecified, uncomplicated: Secondary | ICD-10-CM | POA: Insufficient documentation

## 2019-07-22 MED ORDER — KETOROLAC TROMETHAMINE 30 MG/ML IJ SOLN
30.0000 mg | Freq: Once | INTRAMUSCULAR | Status: DC
  Filled 2019-07-22: qty 1

## 2019-07-22 NOTE — Discharge Instructions (Signed)
You recurrent testicular pain is likely due to a hydrocele.  It is in your best interest to follow up with urologist for further management, which may include surgical removal of the cyst.  Also, avoid marijuana use as it can contribute to recurrent abdominal pain, nausea, vomiting.  Take over the counter pain medication as needed for your pain. Return if you have other concerns.

## 2019-07-22 NOTE — ED Provider Notes (Signed)
[facility]-EMERGENCY DEPT Provider Note   CSN: [id number] Arrival date & time: 07/22/19  1346     History Chief Complaint  Patient presents with  . Groin Swelling  . Abdominal Pain    Logan Cooper is a 29 y.o. male.  The history is provided by the patient and medical records. No language interpreter was used.  Abdominal Pain    29 year old male with history of left testicular hydrocele diagnosed 2 months ago presenting complaining of testicular discomfort.  Patient states for the past 15 years he has had a cyst growing in his left testicle.  He mention "I thought I have 3 testicles".  He mention having recurrent intermittent pain to the affected area radiates to the pit of his stomach.  Pain worse if he wears anything tight around his waist or if he has any pressure directly on the scrotum.  This is a progressive worsening pain ongoing for the 15 years.  He has been seen recently for his complaints, has had CT scan and ultrasound demonstrated a cyst in his left testicle.  He however does not have the financial resources to follow-up with urologist as recommended.  He is here due to worsening pain.  Does have history of marijuana abuse causing abdominal discomfort as well.  Last marijuana use was about 5 days ago.  He does not complain of any fever or chills no dysuria hematuria or rash.  This pain is very similar to pain that he has been experiencing for the past years.  Past Medical History:  Diagnosis Date  . Acid reflux   . Anxiety   . Cyst of testis     Patient Active Problem List   Diagnosis Date Noted  . Cyclical vomiting   . Chest wall pain   . Refractory nausea and vomiting 05/17/2019  . Hypokalemia 05/17/2019  . GERD (gastroesophageal reflux disease) 05/17/2019    Past Surgical History:  Procedure Laterality Date  . HERNIA REPAIR         No family history on file.  Social History   Tobacco Use  . Smoking status: Current  Every Day Smoker  . Smokeless tobacco: Never Used  Substance Use Topics  . Alcohol use: Yes  . Drug use: Yes    Types: Marijuana   0 Home Medications Prior to Admission medications   Medication Sig Start Date End Date Taking? Authorizing Provider  doxycycline (VIBRAMYCIN) 100 MG capsule Take 1 capsule (100 mg total) by mouth 2 (two) times daily. One po bid x 7 days Patient not taking: Reported on 07/07/2019 06/26/19   Bethann Berkshire, MD  famotidine (PEPCID) 20 MG tablet Take 1 tablet (20 mg total) by mouth 2 (two) times daily. 05/18/19 05/17/20  Alwyn Ren, MD  naproxen (NAPROSYN) 375 MG tablet Take 1 tablet (375 mg total) by mouth 2 (two) times daily. Patient not taking: Reported on 07/07/2019 06/22/19   Maxwell Caul, PA-C  nicotine (NICODERM CQ - DOSED IN MG/24 HOURS) 21 mg/24hr patch Place 1 patch (21 mg total) onto the skin daily. Patient not taking: Reported on 06/22/2019 05/19/19   Alwyn Ren, MD  ondansetron (ZOFRAN ODT) 4 MG disintegrating tablet Take 1 tablet (4 mg total) by mouth every 8 (eight) hours as needed for nausea or vomiting. 06/22/19   Maxwell Caul, PA-C  potassium chloride (KLOR-CON) 10 MEQ tablet Take 2 tablets (20 mEq total) by mouth daily. Patient not taking: Reported on 07/07/2019 05/18/19  Alwyn Ren, MD  prochlorperazine (COMPAZINE) 10 MG tablet Take 1 tablet (10 mg total) by mouth 2 (two) times daily as needed for up to 10 doses for nausea or vomiting. Patient not taking: Reported on 07/07/2019 06/23/19   Terald Sleeper, MD    Allergies    Patient has no known allergies.  Review of Systems   Review of Systems  Gastrointestinal: Positive for abdominal pain.  All other systems reviewed and are negative.   Physical Exam Updated Vital Signs BP 119/79 (BP Location: Right Arm)   Pulse 100   Temp 98.3 F (36.8 C) (Oral)   Resp 17   SpO2 100%   Physical Exam Vitals and nursing note reviewed. Exam conducted with a chaperone  present.  Constitutional:      General: He is not in acute distress.    Appearance: He is well-developed.     Comments: Well-appearing laying in bed in no acute discomfort.  HENT:     Head: Atraumatic.  Eyes:     Conjunctiva/sclera: Conjunctivae normal.  Cardiovascular:     Rate and Rhythm: Normal rate and regular rhythm.  Pulmonary:     Effort: Pulmonary effort is normal.     Breath sounds: Normal breath sounds.  Abdominal:     General: Abdomen is flat.     Palpations: Abdomen is soft.     Tenderness: There is no abdominal tenderness.     Hernia: No hernia is present.  Genitourinary:    Comments: Circumcised penis free of lesion or rash.  Left testicle with obvious palpable hydrocele cyst without any overlying skin changes.  No rash.  No hernia.  Area is tender to palpation.  Scrotum is soft. Musculoskeletal:     Cervical back: Neck supple.  Skin:    Findings: No rash.  Neurological:     Mental Status: He is alert.     ED Results / Procedures / Treatments   Labs (all labs ordered are listed, but only abnormal results are displayed) Labs Reviewed - No data to display  EKG None  Radiology No results found.  Procedures Procedures (including critical care time)  Medications Ordered in ED Medications  ketorolac (TORADOL) 30 MG/ML injection 30 mg (30 mg Intramuscular Refused 07/22/19 1512)    ED Course  I have reviewed the triage vital signs and the nursing notes.  Pertinent labs & imaging results that were available during my care of the patient were reviewed by me and considered in my medical decision making (see chart for details).    MDM Rules/Calculators/A&P                      BP 119/79 (BP Location: Right Arm)   Pulse 100   Temp 98.3 F (36.8 C) (Oral)   Resp 17   SpO2 100%   Final Clinical Impression(s) / ED Diagnoses Final diagnoses:  Hydrocele, left    Rx / DC Orders ED Discharge Orders    None     2:46 PM Patient here with recurrent  left testicle pain radiates to his abdomen for 15 years.  Has had several scrotal ultrasound as well as abdominal pelvis CT scan demonstrating a left hydrocele.  I anticipate his pain is related to that.  Low suspicion for testicular torsion.  Low suspicion for acute abdominal pathology.  I strongly encourage patient to follow-up with a urologist for further management of his recurrent discomfort.  I also discussed option of reimaging as well  as obtaining screening labs and through shared decision making we felt due to recurrent similar symptoms and prior work-up, additional work-up is not indicated.   Domenic Moras, PA-C 07/22/19 1541    Maudie Flakes, MD 07/29/19 [phone]

## 2019-07-22 NOTE — ED Triage Notes (Signed)
Pt c/o abd pains and testicle swelling that has been ongoing.

## 2019-07-24 ENCOUNTER — Other Ambulatory Visit: Payer: Self-pay

## 2019-07-24 ENCOUNTER — Encounter (HOSPITAL_COMMUNITY): Payer: Self-pay | Admitting: *Deleted

## 2019-07-24 ENCOUNTER — Emergency Department (HOSPITAL_COMMUNITY)
Admission: EM | Admit: 2019-07-24 | Discharge: 2019-07-24 | Disposition: A | Discharge status: Home / Self Care | Payer: Medicaid - Out of State | Attending: Emergency Medicine | Admitting: Emergency Medicine

## 2019-07-24 DIAGNOSIS — R1031 Right lower quadrant pain: Secondary | ICD-10-CM | POA: Diagnosis not present

## 2019-07-24 DIAGNOSIS — F17210 Nicotine dependence, cigarettes, uncomplicated: Secondary | ICD-10-CM | POA: Insufficient documentation

## 2019-07-24 DIAGNOSIS — R112 Nausea with vomiting, unspecified: Secondary | ICD-10-CM | POA: Insufficient documentation

## 2019-07-24 DIAGNOSIS — R197 Diarrhea, unspecified: Secondary | ICD-10-CM | POA: Diagnosis not present

## 2019-07-24 DIAGNOSIS — R109 Unspecified abdominal pain: Secondary | ICD-10-CM

## 2019-07-24 DIAGNOSIS — Z79899 Other long term (current) drug therapy: Secondary | ICD-10-CM | POA: Insufficient documentation

## 2019-07-24 LAB — COMPREHENSIVE METABOLIC PANEL
ALT: 23 U/L (ref 0–44)
AST: 21 U/L (ref 15–41)
Albumin: 4.9 g/dL (ref 3.5–5.0)
Alkaline Phosphatase: 52 U/L (ref 38–126)
Anion gap: 12 (ref 5–15)
BUN: 10 mg/dL (ref 6–20)
CO2: 24 mmol/L (ref 22–32)
Calcium: 9.6 mg/dL (ref 8.9–10.3)
Chloride: 102 mmol/L (ref 98–111)
Creatinine, Ser: 0.96 mg/dL (ref 0.61–1.24)
GFR calc Af Amer: >60 mL/min (ref >60)
GFR calc non Af Amer: >60 mL/min (ref >60)
Glucose, Bld: 98 mg/dL (ref 70–99)
Potassium: 4.1 mmol/L (ref 3.5–5.1)
Sodium: 138 mmol/L (ref 135–145)
Total Bilirubin: 0.8 mg/dL (ref 0.3–1.2)
Total Protein: 7.5 g/dL (ref 6.5–8.1)

## 2019-07-24 LAB — CBC
HCT: 44.1 % (ref 39.0–52.0)
Hemoglobin: 15.3 g/dL (ref 13.0–17.0)
MCH: 32.5 pg (ref 26.0–34.0)
MCHC: 34.7 g/dL (ref 30.0–36.0)
MCV: 93.6 fL (ref 80.0–100.0)
Platelets: 160 10*3/uL (ref 150–400)
RBC: 4.71 MIL/uL (ref 4.22–5.81)
RDW: 12.5 % (ref 11.5–15.5)
WBC: 8.3 10*3/uL (ref 4.0–10.5)
nRBC: 0 % (ref 0.0–0.2)

## 2019-07-24 LAB — URINALYSIS, ROUTINE W REFLEX MICROSCOPIC
Bilirubin Urine: NEGATIVE
Glucose, UA: NEGATIVE mg/dL
Hgb urine dipstick: NEGATIVE
Ketones, ur: 20 mg/dL — AB
Leukocytes,Ua: NEGATIVE
Nitrite: NEGATIVE
Protein, ur: NEGATIVE mg/dL
Specific Gravity, Urine: 1.025 (ref 1.005–1.030)
pH: 5 (ref 5.0–8.0)

## 2019-07-24 LAB — LIPASE, BLOOD: Lipase: 22 U/L (ref 11–51)

## 2019-07-24 MED ORDER — CAPSAICIN 0.025 % EX CREA
TOPICAL_CREAM | Freq: Once | CUTANEOUS | Status: AC
  Filled 2019-07-24 (×2): qty 60

## 2019-07-24 MED ORDER — ONDANSETRON HCL 4 MG PO TABS
4.0000 mg | ORAL_TABLET | Freq: Four times a day (QID) | ORAL | 0 refills | Status: AC
Start: 2019-07-24 — End: ?

## 2019-07-24 MED ORDER — KETOROLAC TROMETHAMINE 30 MG/ML IJ SOLN
30.0000 mg | Freq: Once | INTRAMUSCULAR | Status: DC
  Filled 2019-07-24: qty 1

## 2019-07-24 MED ORDER — DICYCLOMINE HCL 10 MG/ML IM SOLN
20.0000 mg | Freq: Once | INTRAMUSCULAR | Status: AC
  Administered 2019-07-24: 20 mg via INTRAMUSCULAR
  Filled 2019-07-24: qty 2

## 2019-07-24 MED ORDER — ONDANSETRON HCL 4 MG/2ML IJ SOLN
4.0000 mg | Freq: Once | INTRAMUSCULAR | Status: AC
  Administered 2019-07-24: 4 mg via INTRAVENOUS
  Filled 2019-07-24: qty 2

## 2019-07-24 MED ORDER — DICYCLOMINE HCL 20 MG PO TABS
20.0000 mg | ORAL_TABLET | Freq: Two times a day (BID) | ORAL | 0 refills | Status: AC
Start: 2019-07-24 — End: ?

## 2019-07-24 MED ORDER — SODIUM CHLORIDE 0.9 % IV BOLUS
1000.0000 mL | Freq: Once | INTRAVENOUS | Status: AC
  Administered 2019-07-24: 1000 mL via INTRAVENOUS

## 2019-07-24 MED ORDER — NAPROXEN 250 MG PO TABS
500.0000 mg | ORAL_TABLET | Freq: Once | ORAL | Status: AC
  Administered 2019-07-24: 500 mg via ORAL
  Filled 2019-07-24: qty 2

## 2019-07-24 NOTE — ED Triage Notes (Signed)
Pt c/o RLQ abdominal pain, diarrhea and nausea that started last Wednesday. Pt c/o vomiting that started today. Pt states, "I can't keep anything down". Pt reports he has had abdominal issues for years "but no one can figure out what it is". Pt reports the pain is sometimes in the LLQ and sometimes in the RLQ and always in the lower abdomen.

## 2019-07-24 NOTE — ED Provider Notes (Signed)
[facility] EMERGENCY DEPARTMENT Provider Note   CSN: [id number] Arrival date & time: 07/24/19  1344     History Chief Complaint  Patient presents with  . Abdominal Pain    Logan Cooper is a 29 y.o. male.  HPI   Pt is a 29 y/o male with a h/o acid reflux, anxiety, cyst of testis, who presents to the ED today for eval burning RLQ abd pain that has been present for the last few days to a week. Pain radiates toward the umbilicus. States pain was initially intermittent but for the last few days pain has been constant. Pain feels like a burning pain rated 8/10. Reports associated nausea, vomiting, diarrhea.  Denies fevers, dysuria, frequency, urgency or hematuria.   States when he used to live in [place] he followed with a gastroenterologist and had an EGD. He has never had a colonoscopy.   States he uses marijuana but has not used it in the last few days.  Past Medical History:  Diagnosis Date  . Acid reflux   . Anxiety   . Cyst of testis     Patient Active Problem List   Diagnosis Date Noted  . Cyclical vomiting   . Chest wall pain   . Refractory nausea and vomiting 05/17/2019  . Hypokalemia 05/17/2019  . GERD (gastroesophageal reflux disease) 05/17/2019    Past Surgical History:  Procedure Laterality Date  . ESOPHAGOGASTRODUODENOSCOPY    . HERNIA REPAIR         No family history on file.  Social History   Tobacco Use  . Smoking status: Current Every Day Smoker    Packs/day: 0.30    Types: Cigarettes  . Smokeless tobacco: Never Used  Substance Use Topics  . Alcohol use: Yes    Comment: One drink every couple of weeks.   . Drug use: Yes    Types: Marijuana    Home Medications Prior to Admission medications   Medication Sig Start Date End Date Taking? Authorizing Provider  famotidine (PEPCID) 20 MG tablet Take 1 tablet (20 mg total) by mouth 2 (two) times daily. 05/18/19 05/17/20 Yes Georgette Shell, MD  ondansetron (ZOFRAN ODT) 4 MG disintegrating  tablet Take 1 tablet (4 mg total) by mouth every 8 (eight) hours as needed for nausea or vomiting. 06/22/19  Yes Volanda Napoleon, PA-C  dicyclomine (BENTYL) 20 MG tablet Take 1 tablet (20 mg total) by mouth 2 (two) times daily. 07/24/19   Byrd Rushlow S, PA-C  doxycycline (VIBRAMYCIN) 100 MG capsule Take 1 capsule (100 mg total) by mouth 2 (two) times daily. One po bid x 7 days Patient not taking: Reported on 07/24/2019 06/26/19   Milton Ferguson, MD  naproxen (NAPROSYN) 375 MG tablet Take 1 tablet (375 mg total) by mouth 2 (two) times daily. Patient not taking: Reported on 07/24/2019 06/22/19   Providence Lanius A, PA-C  ondansetron (ZOFRAN) 4 MG tablet Take 1 tablet (4 mg total) by mouth every 6 (six) hours. 07/24/19   Donnelle Olmeda S, PA-C  potassium chloride (KLOR-CON) 10 MEQ tablet Take 2 tablets (20 mEq total) by mouth daily. Patient not taking: Reported on 07/07/2019 05/18/19 07/22/19  Georgette Shell, MD  prochlorperazine (COMPAZINE) 10 MG tablet Take 1 tablet (10 mg total) by mouth 2 (two) times daily as needed for up to 10 doses for nausea or vomiting. Patient not taking: Reported on 07/07/2019 06/23/19 07/22/19  Wyvonnia Dusky, MD    Allergies    Toradol [ketorolac  tromethamine]  Review of Systems   Review of Systems  Constitutional: Negative for fever.  HENT: Negative for ear pain and sore throat.   Eyes: Negative for visual disturbance.  Respiratory: Negative for cough and shortness of breath.   Cardiovascular: Negative for chest pain.  Gastrointestinal: Positive for abdominal pain, nausea and vomiting. Negative for constipation and diarrhea.  Genitourinary: Negative for dysuria and hematuria.  Musculoskeletal: Negative for back pain.  Skin: Negative for rash.  Neurological: Negative for headaches.  All other systems reviewed and are negative.   Physical Exam Updated Vital Signs BP (!) 126/92 (BP Location: Right Arm)   Pulse 97   Temp 98.7 F (37.1 C) (Oral)   Resp 18   Ht  5\' 9"  (1.753 m)   Wt 68 kg   SpO2 97%   BMI 22.15 kg/m   Physical Exam Vitals and nursing note reviewed.  Constitutional:      Appearance: He is well-developed.  HENT:     Head: Normocephalic and atraumatic.  Eyes:     Conjunctiva/sclera: Conjunctivae normal.  Cardiovascular:     Rate and Rhythm: Normal rate and regular rhythm.     Heart sounds: Normal heart sounds. No murmur.  Pulmonary:     Effort: Pulmonary effort is normal. No respiratory distress.     Breath sounds: Normal breath sounds. No wheezing, rhonchi or rales.  Abdominal:     General: Bowel sounds are normal.     Palpations: Abdomen is soft.     Tenderness: There is abdominal tenderness in the right lower quadrant. There is no guarding or rebound.  Musculoskeletal:     Cervical back: Neck supple.  Skin:    General: Skin is warm and dry.  Neurological:     Mental Status: He is alert.     ED Results / Procedures / Treatments   Labs (all labs ordered are listed, but only abnormal results are displayed) Labs Reviewed  URINALYSIS, ROUTINE W REFLEX MICROSCOPIC - Abnormal; Notable for the following components:      Result Value   Ketones, ur 20 (*)    All other components within normal limits  LIPASE, BLOOD  COMPREHENSIVE METABOLIC PANEL  CBC    EKG None  Radiology No results found.  Procedures Procedures (including critical care time)  Medications Ordered in ED Medications  ketorolac (TORADOL) 30 MG/ML injection 30 mg (30 mg Intravenous Refused 07/24/19 1828)  ondansetron (ZOFRAN) injection 4 mg (4 mg Intravenous Given 07/24/19 1830)  dicyclomine (BENTYL) injection 20 mg (20 mg Intramuscular Given 07/24/19 1919)  sodium chloride 0.9 % bolus 1,000 mL (1,000 mLs Intravenous New Bag/Given 07/24/19 1826)  capsaicin (ZOSTRIX) 0.025 % cream ( Topical Given 07/24/19 1914)  naproxen (NAPROSYN) tablet 500 mg (500 mg Oral Given 07/24/19 1914)    ED Course  I have reviewed the triage vital signs and the nursing  notes.  Pertinent labs & imaging results that were available during my care of the patient were reviewed by me and considered in my medical decision making (see chart for details).    MDM Rules/Calculators/A&P                      29 year old male presenting for evaluation of abdominal pain, nausea vomiting and diarrhea.  On chart review patient has history of cannabis hyperemesis syndrome and cyclic vomiting syndrome.  He admits to still using marijuana about a week ago.  Per chart review he also has a history of daily alcohol  use.  Vital signs are reassuring.  Afebrile.  He does have some right lower quadrant tenderness on exam without rebound rigidity or guarding.  He has had several CT scans within the last several months that have been negative.  Will initiate work-up with laboratory work and symptomatic treatment.  Will reassess and determine the need for further imaging.  CBC is without leukocytosis or anemia CMP does not show any evidence of electrolyte abnormality.  Normal kidney and liver function Lipase is negative UA neg for UTI  Patient given pain meds, Bentyl, Zofran and IV fluids.  On reassessment he states his nausea is improved and that his pain is improved to about a 6/10.  Repeat abdominal exam reveals nonsurgical abdomen.  Patient is stating that he has pain to the right lower quadrant however there is no rebound tenderness or guarding he is in no acute distress.  Nontoxic-appearing, nonseptic appearing.  I offered to obtain CT scan to rule out appendicitis however he declined at this time.  I offered to give referral to GI.  He would like to do this and would like to be discharged with symptomatic treatment at this time.  Overall I feel he is at lower risk for appendicitis or other emergent abdominal pathology at this time given the seems to be more of a chronic issue.  I did advise him to return to the ED for new or worsening symptoms he agrees to do so.   Final Clinical  Impression(s) / ED Diagnoses Final diagnoses:  Abdominal pain, unspecified abdominal location    Rx / DC Orders ED Discharge Orders         Ordered    ondansetron (ZOFRAN) 4 MG tablet  Every 6 hours     07/24/19 2027    dicyclomine (BENTYL) 20 MG tablet  2 times daily     07/24/19 2027           Karrie Meres, PA-C 07/24/19 2027    Pollyann Savoy, MD 07/24/19 2308

## 2019-07-24 NOTE — Discharge Instructions (Addendum)

## 2019-07-25 ENCOUNTER — Emergency Department (HOSPITAL_COMMUNITY): Payer: Medicaid - Out of State

## 2019-07-25 ENCOUNTER — Other Ambulatory Visit: Payer: Self-pay

## 2019-07-25 ENCOUNTER — Encounter (HOSPITAL_COMMUNITY): Payer: Self-pay | Admitting: Emergency Medicine

## 2019-07-25 ENCOUNTER — Emergency Department (HOSPITAL_COMMUNITY)
Admission: EM | Admit: 2019-07-25 | Discharge: 2019-07-25 | Disposition: A | Discharge status: Home / Self Care | Payer: Medicaid - Out of State | Attending: Emergency Medicine | Admitting: Emergency Medicine

## 2019-07-25 DIAGNOSIS — Z885 Allergy status to narcotic agent status: Secondary | ICD-10-CM | POA: Insufficient documentation

## 2019-07-25 DIAGNOSIS — F17210 Nicotine dependence, cigarettes, uncomplicated: Secondary | ICD-10-CM | POA: Insufficient documentation

## 2019-07-25 DIAGNOSIS — Z79899 Other long term (current) drug therapy: Secondary | ICD-10-CM | POA: Diagnosis not present

## 2019-07-25 DIAGNOSIS — R1031 Right lower quadrant pain: Secondary | ICD-10-CM | POA: Diagnosis present

## 2019-07-25 LAB — RAPID URINE DRUG SCREEN, HOSP PERFORMED
Amphetamines: NOT DETECTED
Barbiturates: NOT DETECTED
Benzodiazepines: NOT DETECTED
Cocaine: NOT DETECTED
Opiates: NOT DETECTED
Tetrahydrocannabinol: POSITIVE — AB

## 2019-07-25 LAB — CBC
HCT: 45.3 % (ref 39.0–52.0)
Hemoglobin: 15.5 g/dL (ref 13.0–17.0)
MCH: 32.6 pg (ref 26.0–34.0)
MCHC: 34.2 g/dL (ref 30.0–36.0)
MCV: 95.2 fL (ref 80.0–100.0)
Platelets: 158 10*3/uL (ref 150–400)
RBC: 4.76 MIL/uL (ref 4.22–5.81)
RDW: 12.5 % (ref 11.5–15.5)
WBC: 7.5 10*3/uL (ref 4.0–10.5)
nRBC: 0 % (ref 0.0–0.2)

## 2019-07-25 LAB — URINALYSIS, ROUTINE W REFLEX MICROSCOPIC
Bilirubin Urine: NEGATIVE
Glucose, UA: NEGATIVE mg/dL
Hgb urine dipstick: NEGATIVE
Ketones, ur: 20 mg/dL — AB
Leukocytes,Ua: NEGATIVE
Nitrite: NEGATIVE
Protein, ur: NEGATIVE mg/dL
Specific Gravity, Urine: 1.025 (ref 1.005–1.030)
pH: 6 (ref 5.0–8.0)

## 2019-07-25 LAB — LIPASE, BLOOD: Lipase: 23 U/L (ref 11–51)

## 2019-07-25 LAB — COMPREHENSIVE METABOLIC PANEL
ALT: 21 U/L (ref 0–44)
AST: 20 U/L (ref 15–41)
Albumin: 5 g/dL (ref 3.5–5.0)
Alkaline Phosphatase: 51 U/L (ref 38–126)
Anion gap: 8 (ref 5–15)
BUN: 11 mg/dL (ref 6–20)
CO2: 26 mmol/L (ref 22–32)
Calcium: 9.9 mg/dL (ref 8.9–10.3)
Chloride: 105 mmol/L (ref 98–111)
Creatinine, Ser: 0.89 mg/dL (ref 0.61–1.24)
GFR calc Af Amer: >60 mL/min (ref >60)
GFR calc non Af Amer: >60 mL/min (ref >60)
Glucose, Bld: 104 mg/dL — ABNORMAL HIGH (ref 70–99)
Potassium: 4.1 mmol/L (ref 3.5–5.1)
Sodium: 139 mmol/L (ref 135–145)
Total Bilirubin: 0.4 mg/dL (ref 0.3–1.2)
Total Protein: 7.3 g/dL (ref 6.5–8.1)

## 2019-07-25 MED ORDER — SODIUM CHLORIDE 0.9% FLUSH: 3.0000 mL | Freq: Once | INTRAVENOUS | Status: DC

## 2019-07-25 MED ORDER — IOHEXOL 300 MG/ML  SOLN
100.0000 mL | Freq: Once | INTRAMUSCULAR | Status: AC | PRN
  Administered 2019-07-25: 100 mL via INTRAVENOUS

## 2019-07-25 MED ORDER — PANTOPRAZOLE SODIUM 40 MG IV SOLR
40.0000 mg | Freq: Once | INTRAVENOUS | Status: AC
  Administered 2019-07-25: 40 mg via INTRAVENOUS
  Filled 2019-07-25: qty 40

## 2019-07-25 NOTE — ED Notes (Signed)
Pt asked this RN upon walking into the room, "Is the reason why they won't give me pain medicine is because I smoke weed? They're treating me like a fucking dope head. If I was I wouldn't be in here, I'd be on the streets." RN apologized and stated pt was receiving Protonix and it should help.

## 2019-07-25 NOTE — ED Provider Notes (Signed)
[facility] EMERGENCY DEPARTMENT Provider Note   CSN: [id number] Arrival date & time: 07/25/19  1210     History Chief Complaint  Patient presents with  . Abdominal Pain    Logan Cooper is a 29 y.o. male.  The history is provided by the patient. No language interpreter was used.  Abdominal Pain Pain location:  RLQ Pain quality: sharp and shooting   Pain radiates to:  Does not radiate Pain severity:  Severe Onset quality:  Gradual Timing:  Constant Progression:  Worsening Chronicity:  New Context: not sick contacts   Relieved by:  Nothing Worsened by:  Nothing Ineffective treatments:  None tried Associated symptoms: vomiting        Past Medical History:  Diagnosis Date  . Acid reflux   . Anxiety   . Cyst of testis     Patient Active Problem List   Diagnosis Date Noted  . Cyclical vomiting   . Chest wall pain   . Refractory nausea and vomiting 05/17/2019  . Hypokalemia 05/17/2019  . GERD (gastroesophageal reflux disease) 05/17/2019    Past Surgical History:  Procedure Laterality Date  . ESOPHAGOGASTRODUODENOSCOPY    . HERNIA REPAIR         History reviewed. No pertinent family history.  Social History   Tobacco Use  . Smoking status: Current Every Day Smoker    Packs/day: 0.30    Types: Cigarettes  . Smokeless tobacco: Never Used  Substance Use Topics  . Alcohol use: Yes    Comment: One drink every couple of weeks.   . Drug use: Yes    Types: Marijuana    Home Medications Prior to Admission medications   Medication Sig Start Date End Date Taking? Authorizing Provider  famotidine (PEPCID) 20 MG tablet Take 1 tablet (20 mg total) by mouth 2 (two) times daily. 05/18/19 05/17/20 Yes Georgette Shell, MD  ondansetron (ZOFRAN ODT) 4 MG disintegrating tablet Take 1 tablet (4 mg total) by mouth every 8 (eight) hours as needed for nausea or vomiting. 06/22/19  Yes Volanda Napoleon, PA-C  dicyclomine (BENTYL) 20 MG tablet Take 1 tablet (20 mg  total) by mouth 2 (two) times daily. 07/24/19   Couture, Cortni S, PA-C  doxycycline (VIBRAMYCIN) 100 MG capsule Take 1 capsule (100 mg total) by mouth 2 (two) times daily. One po bid x 7 days Patient not taking: Reported on 07/24/2019 06/26/19   Milton Ferguson, MD  naproxen (NAPROSYN) 375 MG tablet Take 1 tablet (375 mg total) by mouth 2 (two) times daily. Patient not taking: Reported on 07/24/2019 06/22/19   Providence Lanius A, PA-C  ondansetron (ZOFRAN) 4 MG tablet Take 1 tablet (4 mg total) by mouth every 6 (six) hours. 07/24/19   Couture, Cortni S, PA-C  potassium chloride (KLOR-CON) 10 MEQ tablet Take 2 tablets (20 mEq total) by mouth daily. Patient not taking: Reported on 07/07/2019 05/18/19 07/22/19  Georgette Shell, MD  prochlorperazine (COMPAZINE) 10 MG tablet Take 1 tablet (10 mg total) by mouth 2 (two) times daily as needed for up to 10 doses for nausea or vomiting. Patient not taking: Reported on 07/07/2019 06/23/19 07/22/19  Wyvonnia Dusky, MD    Allergies    Toradol [ketorolac tromethamine]  Review of Systems   Review of Systems  Gastrointestinal: Positive for abdominal pain and vomiting.  All other systems reviewed and are negative.   Physical Exam Updated Vital Signs BP 115/73 (BP Location: Right Arm)   Pulse 81  Temp 98.9 F (37.2 C) (Oral)   Resp 15   SpO2 98%   Physical Exam Vitals and nursing note reviewed.  Constitutional:      Appearance: He is well-developed.  HENT:     Head: Normocephalic.  Cardiovascular:     Rate and Rhythm: Normal rate and regular rhythm.  Pulmonary:     Effort: Pulmonary effort is normal.  Abdominal:     General: There is no distension.     Palpations: Abdomen is soft.     Tenderness: There is abdominal tenderness in the right lower quadrant.  Musculoskeletal:        General: Normal range of motion.     Cervical back: Normal range of motion.  Skin:    General: Skin is warm.  Neurological:     General: No focal deficit present.      Mental Status: He is alert and oriented to person, place, and time.  Psychiatric:        Mood and Affect: Mood normal.     ED Results / Procedures / Treatments   Labs (all labs ordered are listed, but only abnormal results are displayed) Labs Reviewed  COMPREHENSIVE METABOLIC PANEL - Abnormal; Notable for the following components:      Result Value   Glucose, Bld 104 (*)    All other components within normal limits  LIPASE, BLOOD  CBC  URINALYSIS, ROUTINE W REFLEX MICROSCOPIC  RAPID URINE DRUG SCREEN, HOSP PERFORMED    EKG None  Radiology No results found.  Procedures Procedures (including critical care time)  Medications Ordered in ED Medications  sodium chloride flush (NS) 0.9 % injection 3 mL (has no administration in time range)    ED Course  I have reviewed the triage vital signs and the nursing notes.  Pertinent labs & imaging results that were available during my care of the patient were reviewed by me and considered in my medical decision making (see chart for details).    MDM Rules/Calculators/A&P                      MDM:  Pt advised to return if worsening pain.  Pt has increased pain rlq CT abdomen and pelvis ordered.   Pt's care turned over to Tammy triplett &pm.   Final Clinical Impression(s) / ED Diagnoses Final diagnoses:  Right lower quadrant abdominal pain    Rx / DC Orders ED Discharge Orders    None       Osie Cheeks 07/25/19 1850    Bethann Berkshire, MD 07/27/19 1517

## 2019-07-25 NOTE — ED Provider Notes (Signed)
   Patient signed out to me by Langston Masker, PA-C pending completion of work-up and reassessment.  Patient here with generalized abdominal pain with nausea and vomiting for several days.  Seen here yesterday as well.  He admits to cyclic vomiting and marijuana use.  His work-up this evening is reassuring.  Laboratory studies show no evidence of leukocytosis or electrolyte imbalance.    On recheck, patient resting comfortably.  No active vomiting during ER visit.  CT of the abdomen and pelvis ordered and also without acute process.  Patient is felt to be stable for discharge home.  Referral information provided for GI.   CT ABDOMEN PELVIS W CONTRAST  Result Date: 07/25/2019 CLINICAL DATA:  Right lower quadrant pain with nausea and vomiting. EXAM: CT ABDOMEN AND PELVIS WITH CONTRAST TECHNIQUE: Multidetector CT imaging of the abdomen and pelvis was performed using the standard protocol following bolus administration of intravenous contrast. CONTRAST:  OMNIPAQUE IOHEXOL 300 MG/ML  SOLN COMPARISON:  CT abdomen pelvis dated Jun 22, 2019. FINDINGS: Lower chest: No acute abnormality. Hepatobiliary: No focal liver abnormality is seen. No gallstones, gallbladder wall thickening, or biliary dilatation. Pancreas: Unremarkable. No pancreatic ductal dilatation or surrounding inflammatory changes. Spleen: Normal in size without focal abnormality. Adrenals/Urinary Tract: Adrenal glands are unremarkable. Kidneys are normal, without renal calculi, focal lesion, or hydronephrosis. Bladder is unremarkable. Stomach/Bowel: Stomach is within normal limits. Appendix appears normal. No evidence of bowel wall thickening, distention, or inflammatory changes. Vascular/Lymphatic: No significant vascular findings are present. No enlarged abdominal or pelvic lymph nodes. Reproductive: Prostate is unremarkable. Other: No abdominal wall hernia or abnormality. No abdominopelvic ascites. No pneumoperitoneum. Musculoskeletal: No acute  or significant osseous findings. IMPRESSION: No acute intra-abdominal process. Normal appendix. Electronically Signed   By: Obie Dredge M.D.   On: 07/25/2019 19:41   Labs Reviewed  COMPREHENSIVE METABOLIC PANEL - Abnormal; Notable for the following components:      Result Value   Glucose, Bld 104 (*)    All other components within normal limits  URINALYSIS, ROUTINE W REFLEX MICROSCOPIC - Abnormal; Notable for the following components:   APPearance HAZY (*)    Ketones, ur 20 (*)    All other components within normal limits  RAPID URINE DRUG SCREEN, HOSP PERFORMED - Abnormal; Notable for the following components:   Tetrahydrocannabinol POSITIVE (*)    All other components within normal limits  LIPASE, BLOOD  CBC      Pauline Aus, PA-C 07/25/19 2017    Bethann Berkshire, MD 07/27/19 1517

## 2019-07-25 NOTE — Discharge Instructions (Addendum)
Your CT of your abdomen and pelvis this evening did not show any evidence of abdominal infection and your appendix appears normal.  Call the gastroenterologist listed to schedule a follow-up appointment.

## 2019-07-25 NOTE — ED Triage Notes (Signed)
Pt c/o of generalized abdominal with n/v since last Wednesday. Pt states " Ive had this problem for years"

## 2021-04-11 IMAGING — CT CT ABD-PELV W/ CM
2 of 7 series · 14 of 46 positions shown, 19 images · IV contrast (Omnipaque or Isovue)
Comparison: CT abdomen pelvis dated June 22, 2019.

CLINICAL DATA: Right lower quadrant pain with nausea and vomiting.

EXAM:
CT ABDOMEN AND PELVIS WITH CONTRAST
TECHNIQUE: Multidetector CT imaging of the abdomen and pelvis was performed
using the standard protocol following bolus administration of
intravenous contrast.
CONTRAST:  100mL OMNIPAQUE IOHEXOL 300 MG/ML  SOLN

[Series 5: axial st · axial · 0.71mm/px · z∈[-716,-336]mm · 11 of 90 slices shown, 16 images]
[im 7/90  soft-tissue]
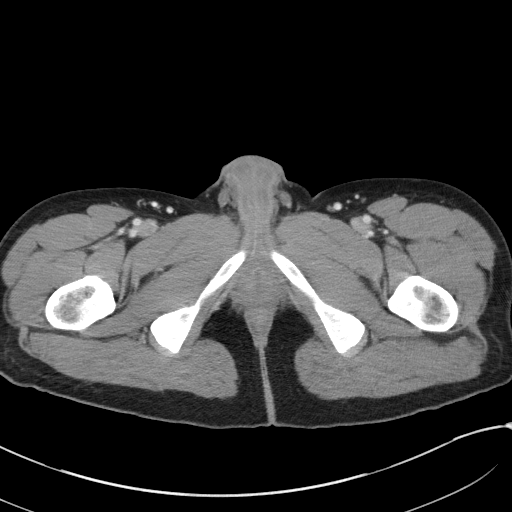
[im 7/90  bone]
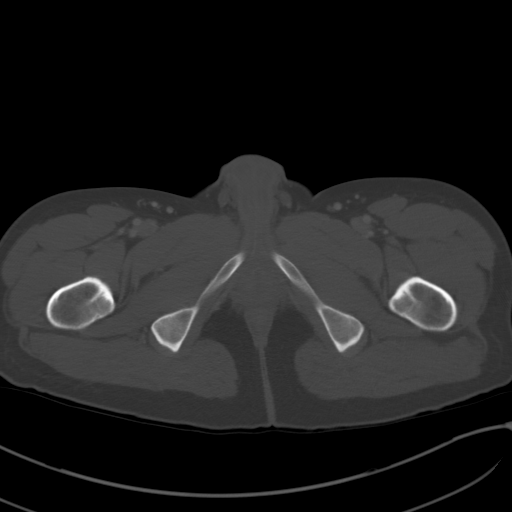
[im 14/90  soft-tissue]
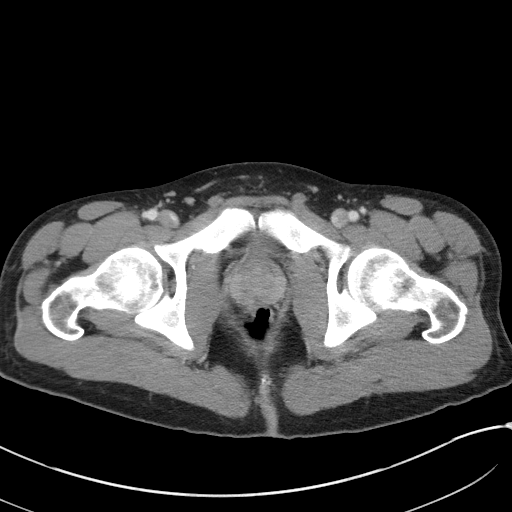
[im 28/90  soft-tissue]
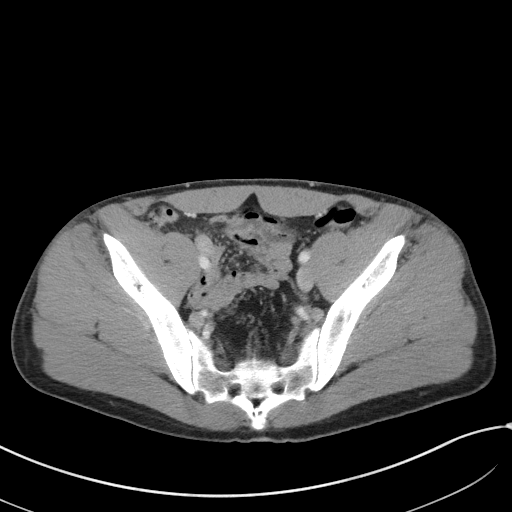
[im 35/90  soft-tissue]
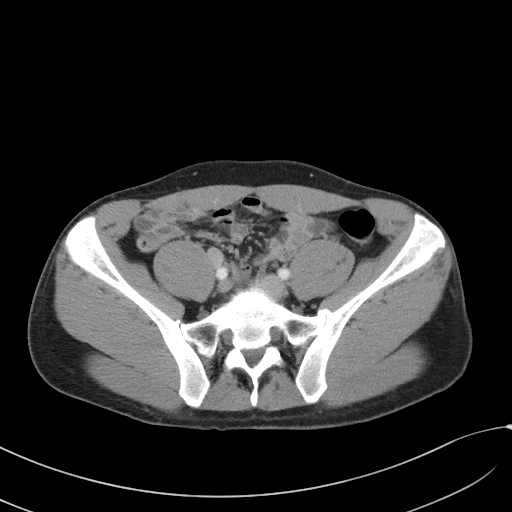
[im 42/90  soft-tissue]
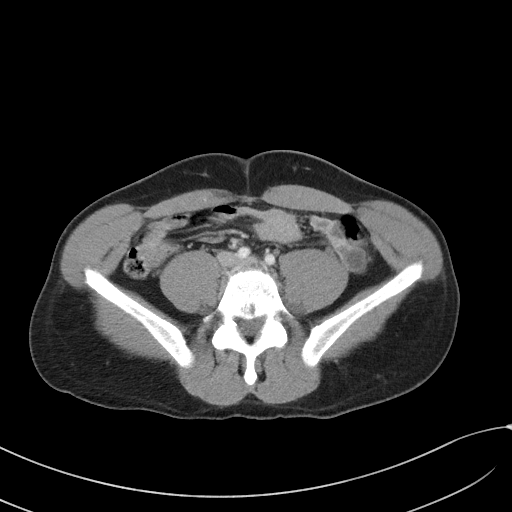
[im 48/90  soft-tissue]
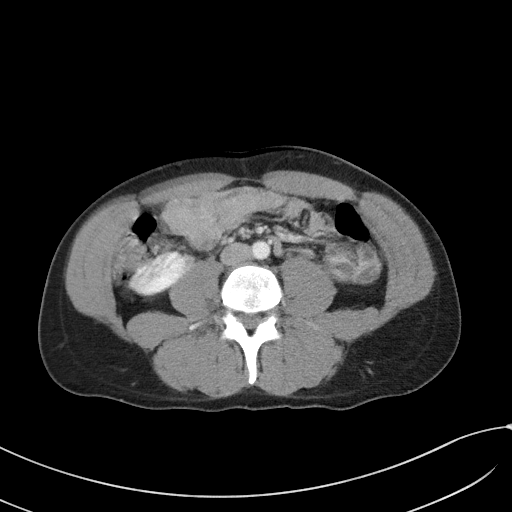
[im 55/90  soft-tissue]
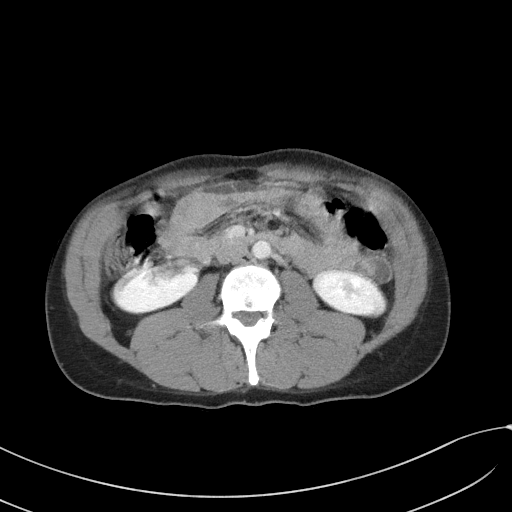
[im 62/90  lung]
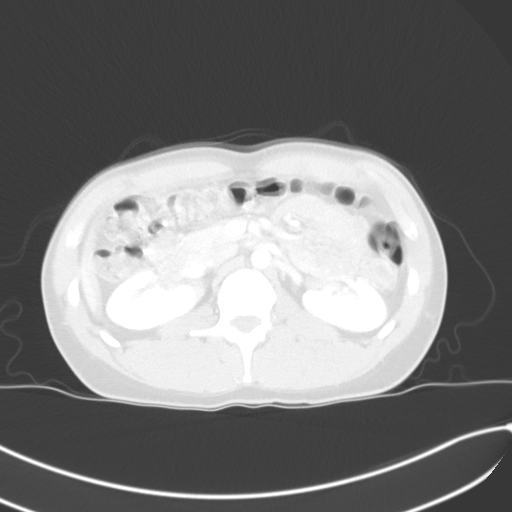
[im 69/90  soft-tissue]
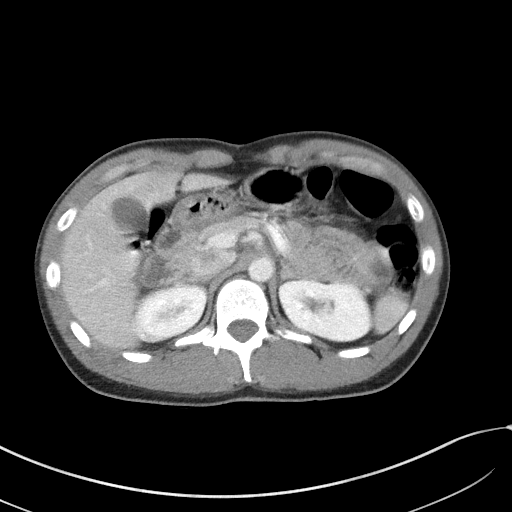
[im 69/90  lung]
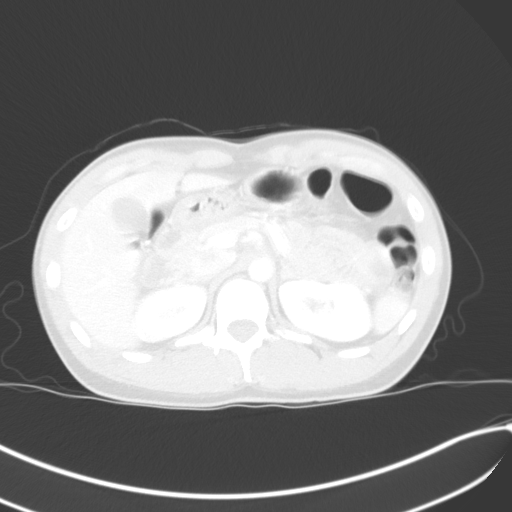
[im 76/90  soft-tissue]
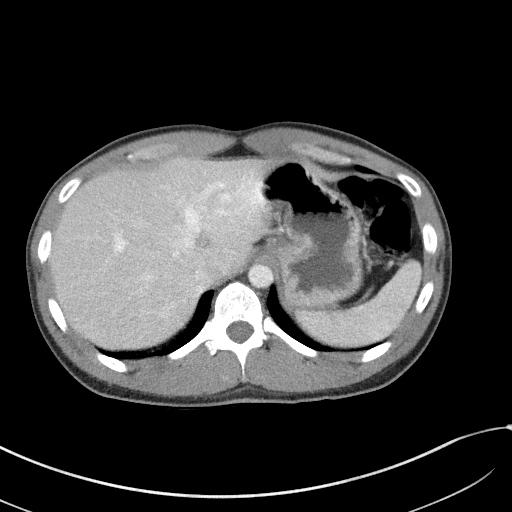
[im 76/90  lung]
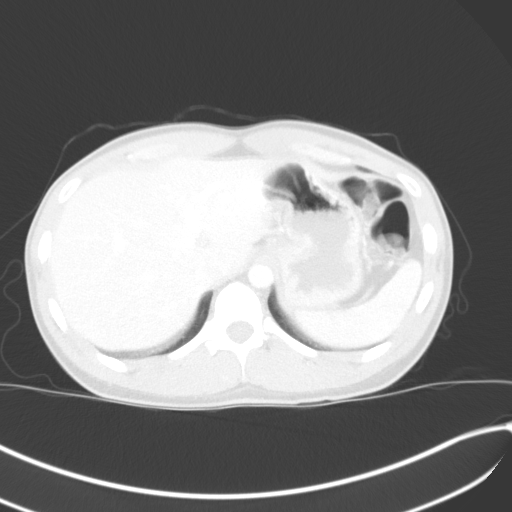
[im 76/90  bone]
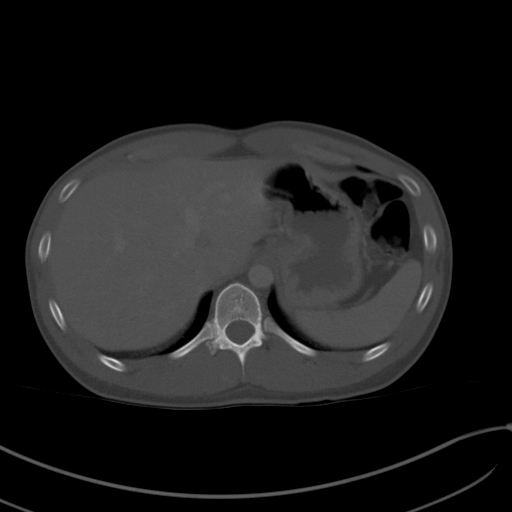
[im 83/90  soft-tissue]
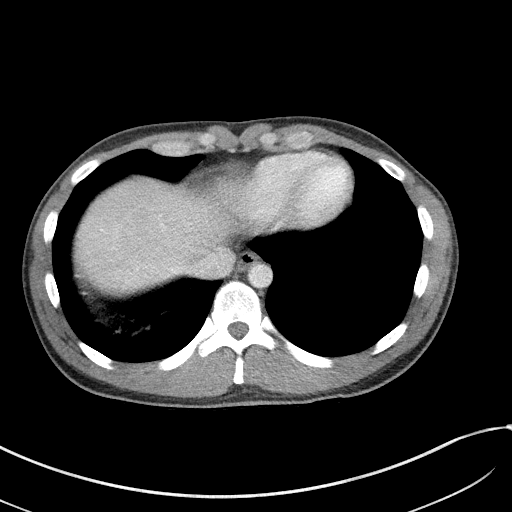
[im 83/90  lung]
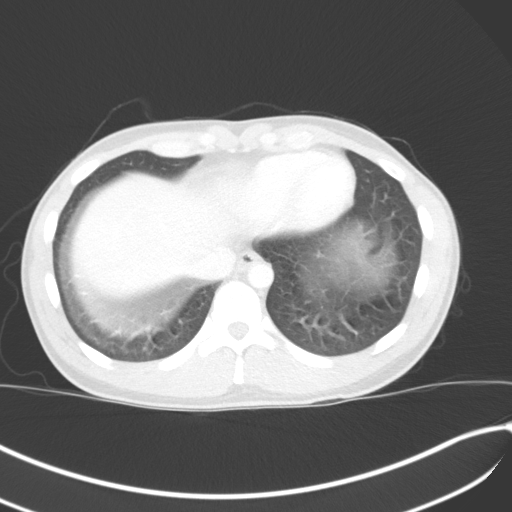

[Series 8: coronal st · coronal · 0.78mm/px · 3 of 107 slices shown]
[im 27/107  soft-tissue]
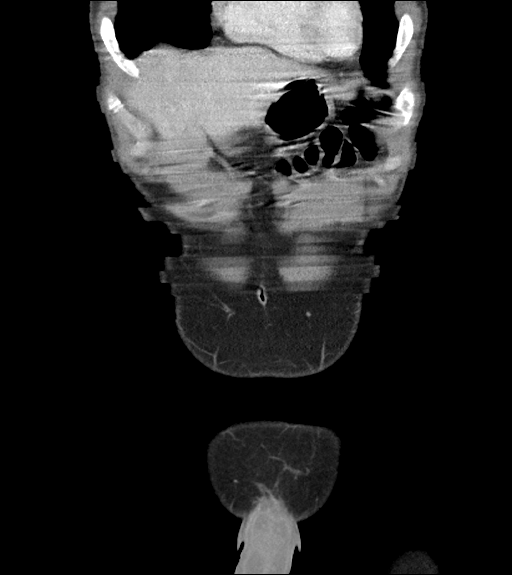
[im 54/107  soft-tissue]
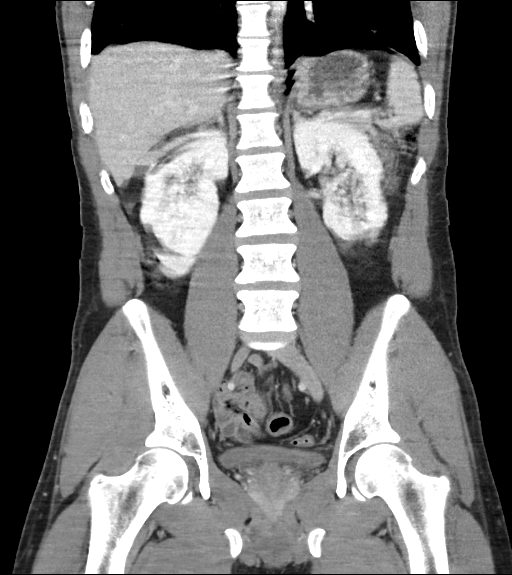
[im 80/107  soft-tissue]
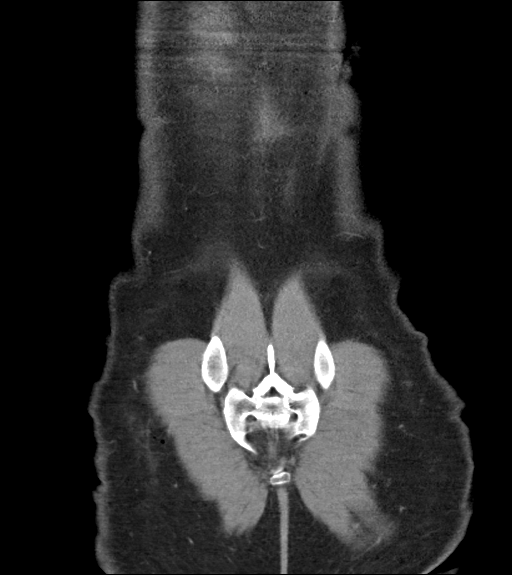

[14 of 46 positions shown; findings below may reference images not displayed]

FINDINGS: Lower chest: No acute abnormality.

Hepatobiliary: No focal liver abnormality is seen. No gallstones,
gallbladder wall thickening, or biliary dilatation.

Pancreas: Unremarkable. No pancreatic ductal dilatation or
surrounding inflammatory changes.

Spleen: Normal in size without focal abnormality.

Adrenals/Urinary Tract: Adrenal glands are unremarkable. Kidneys are
normal, without renal calculi, focal lesion, or hydronephrosis.
Bladder is unremarkable.

Stomach/Bowel: Stomach is within normal limits. Appendix appears
normal. No evidence of bowel wall thickening, distention, or
inflammatory changes.

Vascular/Lymphatic: No significant vascular findings are present. No
enlarged abdominal or pelvic lymph nodes.

Reproductive: Prostate is unremarkable.

Other: No abdominal wall hernia or abnormality. No abdominopelvic
ascites. No pneumoperitoneum.

Musculoskeletal: No acute or significant osseous findings.
IMPRESSION: No acute intra-abdominal process. Normal appendix.
